# Patient Record
Sex: Female | Born: 1940 | Race: White | Hispanic: No | Marital: Married | Smoking: Never smoker
Health system: Southern US, Community
[De-identification: ages and names within clinical notes are randomized; demographics above are authoritative.]

## PROBLEM LIST (undated history)

## (undated) DIAGNOSIS — J45909 Unspecified asthma, uncomplicated: Secondary | ICD-10-CM

## (undated) DIAGNOSIS — F411 Generalized anxiety disorder: Secondary | ICD-10-CM

## (undated) DIAGNOSIS — K5190 Ulcerative colitis, unspecified, without complications: Secondary | ICD-10-CM

## (undated) DIAGNOSIS — I10 Essential (primary) hypertension: Secondary | ICD-10-CM

## (undated) HISTORY — PX: BACK SURGERY: SHX140

---

## 2015-10-27 ENCOUNTER — Emergency Department (HOSPITAL_BASED_OUTPATIENT_CLINIC_OR_DEPARTMENT_OTHER): Payer: Medicare HMO

## 2015-10-27 ENCOUNTER — Emergency Department (HOSPITAL_BASED_OUTPATIENT_CLINIC_OR_DEPARTMENT_OTHER)
Admission: EM | Admit: 2015-10-27 | Discharge: 2015-10-27 | Disposition: A | Discharge status: Home / Self Care | Payer: Medicare HMO | Attending: Emergency Medicine | Admitting: Emergency Medicine

## 2015-10-27 ENCOUNTER — Encounter (HOSPITAL_BASED_OUTPATIENT_CLINIC_OR_DEPARTMENT_OTHER): Payer: Self-pay | Admitting: Emergency Medicine

## 2015-10-27 DIAGNOSIS — R1030 Lower abdominal pain, unspecified: Secondary | ICD-10-CM | POA: Insufficient documentation

## 2015-10-27 DIAGNOSIS — I10 Essential (primary) hypertension: Secondary | ICD-10-CM | POA: Diagnosis not present

## 2015-10-27 DIAGNOSIS — J45909 Unspecified asthma, uncomplicated: Secondary | ICD-10-CM | POA: Insufficient documentation

## 2015-10-27 DIAGNOSIS — K529 Noninfective gastroenteritis and colitis, unspecified: Secondary | ICD-10-CM | POA: Diagnosis not present

## 2015-10-27 DIAGNOSIS — F411 Generalized anxiety disorder: Secondary | ICD-10-CM | POA: Diagnosis not present

## 2015-10-27 DIAGNOSIS — R5383 Other fatigue: Secondary | ICD-10-CM | POA: Diagnosis present

## 2015-10-27 DIAGNOSIS — R195 Other fecal abnormalities: Secondary | ICD-10-CM | POA: Insufficient documentation

## 2015-10-27 DIAGNOSIS — Z79899 Other long term (current) drug therapy: Secondary | ICD-10-CM | POA: Diagnosis not present

## 2015-10-27 HISTORY — DX: Generalized anxiety disorder: F41.1

## 2015-10-27 HISTORY — DX: Unspecified asthma, uncomplicated: J45.909

## 2015-10-27 HISTORY — DX: Essential (primary) hypertension: I10

## 2015-10-27 HISTORY — DX: Ulcerative colitis, unspecified, without complications: K51.90

## 2015-10-27 LAB — CBC WITH DIFFERENTIAL/PLATELET
Basophils Absolute: 0 10*3/uL (ref 0.0–0.1)
Basophils Relative: 1 %
Eosinophils Absolute: 0.4 10*3/uL (ref 0.0–0.7)
Eosinophils Relative: 5 %
HEMATOCRIT: 34.1 % — AB (ref 36.0–46.0)
HEMOGLOBIN: 11.2 g/dL — AB (ref 12.0–15.0)
LYMPHS ABS: 0.9 10*3/uL (ref 0.7–4.0)
Lymphocytes Relative: 11 %
MCH: 28.9 pg (ref 26.0–34.0)
MCHC: 32.8 g/dL (ref 30.0–36.0)
MCV: 87.9 fL (ref 78.0–100.0)
MONOS PCT: 18 %
Monocytes Absolute: 1.5 10*3/uL — ABNORMAL HIGH (ref 0.1–1.0)
NEUTROS PCT: 65 %
Neutro Abs: 5.4 10*3/uL (ref 1.7–7.7)
Platelets: 579 10*3/uL — ABNORMAL HIGH (ref 150–400)
RBC: 3.88 MIL/uL (ref 3.87–5.11)
RDW: 13.2 % (ref 11.5–15.5)
WBC: 8.3 10*3/uL (ref 4.0–10.5)

## 2015-10-27 LAB — COMPREHENSIVE METABOLIC PANEL
ALBUMIN: 3 g/dL — AB (ref 3.5–5.0)
ALK PHOS: 77 U/L (ref 38–126)
ALT: 14 U/L (ref 14–54)
ANION GAP: 6 (ref 5–15)
AST: 17 U/L (ref 15–41)
BUN: 18 mg/dL (ref 6–20)
CALCIUM: 8.7 mg/dL — AB (ref 8.9–10.3)
CHLORIDE: 105 mmol/L (ref 101–111)
CO2: 27 mmol/L (ref 22–32)
CREATININE: 1.06 mg/dL — AB (ref 0.44–1.00)
GFR calc non Af Amer: 50 mL/min — ABNORMAL LOW (ref >60)
GFR, EST AFRICAN AMERICAN: 58 mL/min — AB (ref >60)
GLUCOSE: 100 mg/dL — AB (ref 65–99)
Potassium: 3.8 mmol/L (ref 3.5–5.1)
SODIUM: 138 mmol/L (ref 135–145)
Total Bilirubin: 0.4 mg/dL (ref 0.3–1.2)
Total Protein: 6.5 g/dL (ref 6.5–8.1)

## 2015-10-27 LAB — OCCULT BLOOD X 1 CARD TO LAB, STOOL: Fecal Occult Bld: POSITIVE — AB

## 2015-10-27 LAB — LIPASE, BLOOD: Lipase: 21 U/L (ref 11–51)

## 2015-10-27 MED ORDER — PREDNISONE 10 MG PO TABS: 20.0000 mg | ORAL_TABLET | Freq: Two times a day (BID) | ORAL | Status: DC

## 2015-10-27 MED ORDER — SODIUM CHLORIDE 0.9 % IV BOLUS (SEPSIS)
1000.0000 mL | Freq: Once | INTRAVENOUS | Status: AC
  Administered 2015-10-27: 1000 mL via INTRAVENOUS

## 2015-10-27 MED ORDER — IOPAMIDOL (ISOVUE-300) INJECTION 61%
100.0000 mL | Freq: Once | INTRAVENOUS | Status: AC | PRN
  Administered 2015-10-27: 100 mL via INTRAVENOUS

## 2015-10-27 MED ORDER — CIPROFLOXACIN HCL 500 MG PO TABS: 500.0000 mg | ORAL_TABLET | Freq: Two times a day (BID) | ORAL | Status: DC

## 2015-10-27 MED ORDER — METRONIDAZOLE 500 MG PO TABS: 500.0000 mg | ORAL_TABLET | Freq: Three times a day (TID) | ORAL | Status: DC

## 2015-10-27 MED FILL — metroNIDAZOLE 500 MG TABS: 500 | 7 days supply | Qty: 21 | Fill #0

## 2015-10-27 MED FILL — predniSONE 10 MG TABS: 10 | 5 days supply | Qty: 20 | Fill #0

## 2015-10-27 MED FILL — CIPROFLOXACIN HCL 500 MG TA: 500 | 7 days supply | Qty: 14 | Fill #0

## 2015-10-27 NOTE — ED Provider Notes (Signed)
CSN: [id number]     Arrival date & time 10/27/15  1001 History   First MD Initiated Contact with Patient 10/27/15 1044     Chief Complaint  Patient presents with  . Fatigue     (Consider location/radiation/quality/duration/timing/severity/associated sxs/prior Treatment) HPI Comments: Patient is a 75 year old female with history of ulcerative colitis, hypertension, and asthma. She presents for evaluation of lower abdominal discomfort and bloody stools which have been worsening over the past 2 weeks. She does report that she was on multiple antibiotics recently for bronchitis. She was seen by digestive health services in [place], however no other treatment was initiated. He denies any fevers or chills. She denies any vomiting. She denies any ill contacts.  The history is provided by the patient.    Past Medical History  Diagnosis Date  . Hypertension   . Asthma   . Generalized anxiety disorder   . Ulcerative colitis [facility](HCC)    Past Surgical History  Procedure Laterality Date  . Back surgery     No family history on file. Social History  Substance Use Topics  . Smoking status: Never Smoker   . Smokeless tobacco: None  . Alcohol Use: No   OB History    No data available     Review of Systems  All other systems reviewed and are negative.     Allergies  Review of patient's allergies indicates no known allergies.  Home Medications   Prior to Admission medications   Medication Sig Start Date End Date Taking? Authorizing Provider  ALPRAZolam Prudy Feeler(XANAX) 0.25 MG tablet Take 0.25 mg by mouth at bedtime as needed for anxiety.   Yes Historical Provider, MD  estrogens conjugated, synthetic A, (CENESTIN) 0.3 MG tablet Take 0.3 mg by mouth daily.   Yes Historical Provider, MD  Fluticasone Furoate-Vilanterol (BREO ELLIPTA IN) Inhale into the lungs.   Yes Historical Provider, MD  losartan (COZAAR) 50 MG tablet Take 50 mg by mouth daily.   Yes Historical Provider, MD  timolol  (TIMOPTIC) 0.5 % ophthalmic solution 1 drop 2 (two) times daily.   Yes Historical Provider, MD   There were no vitals taken for this visit. Physical Exam  Constitutional: She is oriented to person, place, and time. She appears well-developed and well-nourished. No distress.  HENT:  Head: Normocephalic and atraumatic.  Neck: Normal range of motion. Neck supple.  Cardiovascular: Normal rate and regular rhythm.  Exam reveals no gallop and no friction rub.   No murmur heard. Pulmonary/Chest: Effort normal and breath sounds normal. No respiratory distress. She has no wheezes.  Abdominal: Soft. Bowel sounds are normal. She exhibits no distension. There is no tenderness.  Genitourinary: Guaiac positive stool.  Rectal examination reveals no palpable masses, significantly inflamed hemorrhoids, or other abnormality.  Musculoskeletal: Normal range of motion.  Neurological: She is alert and oriented to person, place, and time.  Skin: Skin is warm and dry. She is not diaphoretic.  Nursing note and vitals reviewed.   ED Course  Procedures (including critical care time) Labs Review Labs Reviewed - No data to display  Imaging Review No results found. I have personally reviewed and evaluated these images and lab results as part of my medical decision-making.    MDM   Final diagnoses:  None    CT scan does show colitis. The remainder of her laboratory studies and workup are reassuring. Her abdominal exam does not suggest a surgical abdomen. She will be treated with Cipro and Flagyl, prednisone, and follow-up with her  gastroenterologist in [place]. She has recently been on antibiotics and this colitis could possibly be related to C. difficile. She also has a history of ulcerative colitis and could also be related to this.    Geoffery Lyons, MD 10/27/15 [phone]

## 2015-10-27 NOTE — ED Notes (Signed)
Pt returned from ct,  

## 2015-10-27 NOTE — Discharge Instructions (Signed)
Cipro and Flagyl as prescribed.  Prednisone as prescribed.  Follow-up with your gastroenterologist the end of this week, and return to the ER symptoms significantly worsen or change.   Colitis Colitis is inflammation of the colon. Colitis may last a short time (acute) or it may last a long time (chronic). CAUSES This condition may be caused by:  Viruses.  Bacteria.  Reactions to medicine.  Certain autoimmune diseases, such as Crohn disease or ulcerative colitis. SYMPTOMS Symptoms of this condition include:  Diarrhea.  Passing bloody or tarry stool.  Pain.  Fever.  Vomiting.  Tiredness (fatigue).  Weight loss.  Bloating.  Sudden increase in abdominal pain.  Having fewer bowel movements than usual. DIAGNOSIS This condition is diagnosed with a stool test or a blood test. You may also have other tests, including X-rays, a CT scan, or a colonoscopy. TREATMENT Treatment may include:  Resting the bowel. This involves not eating or drinking for a period of time.  Fluids that are given through an IV tube.  Medicine for pain and diarrhea.  Antibiotic medicines.  Cortisone medicines.  Surgery. HOME CARE INSTRUCTIONS Eating and Drinking  Follow instructions from your health care provider about eating or drinking restrictions.  Drink enough fluid to keep your urine clear or pale yellow.  Work with a dietitian to determine which foods cause your condition to flare up.  Avoid foods that cause flare-ups.  Eat a well-balanced diet. Medicines  Take over-the-counter and prescription medicines only as told by your health care provider.  If you were prescribed an antibiotic medicine, take it as told by your health care provider. Do not stop taking the antibiotic even if you start to feel better. General Instructions  Keep all follow-up visits as told by your health care provider. This is important. SEEK MEDICAL CARE IF:  Your symptoms do not go away.  You  develop new symptoms. SEEK IMMEDIATE MEDICAL CARE IF:  You have a fever that does not go away with treatment.  You develop chills.  You have extreme weakness, fainting, or dehydration.  You have repeated vomiting.  You develop severe pain in your abdomen.  You pass bloody or tarry stool.   This information is not intended to replace advice given to you by your health care provider. Make sure you discuss any questions you have with your health care provider.   Document Released: 08/05/2004 Document Revised: 03/19/2015 Document Reviewed: 10/21/2014 Elsevier Interactive Patient Education [employer]2016 Elsevier Inc.

## 2015-10-27 NOTE — ED Notes (Signed)
MD at bedside. 

## 2015-10-27 NOTE — ED Notes (Signed)
Pt's main complaint is rectal pain, pt states it is raw and that stool is unable to move due to how raw the inside of stomach has become, secondly she states she has had multiple loose bloody bm's with last bowel movement that she describes as bright red blood. Pt would like some IV fluids for dehydration

## 2015-10-27 NOTE — ED Notes (Signed)
Pt states she started having bloody stools 2 weeks ago and was seen at digestive center in [place], states she was tested for cancer and was told she had blood in stool, paperwork from digestive center states she had a negative hemocult, per pt she is followed at [facility] also, today complains of weakness and dehydration among multiple bloody stools

## 2015-10-27 NOTE — ED Notes (Signed)
Patient transported to X-ray 

## 2016-08-03 ENCOUNTER — Ambulatory Visit (INDEPENDENT_AMBULATORY_CARE_PROVIDER_SITE_OTHER): Payer: Medicare HMO | Admitting: Sports Medicine

## 2016-08-03 ENCOUNTER — Encounter: Payer: Self-pay | Admitting: Sports Medicine

## 2016-08-03 DIAGNOSIS — M25561 Pain in right knee: Secondary | ICD-10-CM | POA: Diagnosis not present

## 2016-08-03 DIAGNOSIS — M25551 Pain in right hip: Secondary | ICD-10-CM

## 2016-08-03 DIAGNOSIS — M25562 Pain in left knee: Secondary | ICD-10-CM | POA: Diagnosis not present

## 2016-08-03 NOTE — Progress Notes (Signed)
Margaret Wilkins - 76 y.o. female MRN [id number]  Date of birth: 1940-11-23  SUBJECTIVE:  Including CC & ROS.   Margaret Wilkins is a 76 year old female that is presenting with right hip pain and bilateral knee pain. She reports that her right hip pain started on the anterior aspect of her groin and felt that may be related to performing a post breast. She denied any bruising or swelling when this first occurred. She is now locating the pain predominantly on the lateral aspect of her right hip but also feels it on the left lateral hip as well. She has taken Aleve with some improvement of the pain. This pain is been present for about one month. The pain remains to be unchanged and it feels like a burn. She feels pain when she rolls over to that side at night.  She is also reporting bilateral knee pain that is anterior nature. This pain started 3-4 months ago. She notices pain when she is going up or down stairs. The plain disperses when she is done walking up or down stairs. The pain is remaining the same. She denies any history of any knee trauma or injury. She denies any history of knee surgery.  She does report a remote history of falling off a golf cart while at a wedding and reports her SI joint was subluxed. She had to go to a chiropractor in order for this to be put back in place.  She has a history of ulcerative colitis but is only taken a couple courses of prednisone and no long time use.  ROS: No unexpected weight loss, fever, chills, swelling, instability, numbness/tingling, redness, otherwise see HPI    HISTORY: Past Medical, Surgical, Social, and Family History Reviewed & Updated per EMR.   Pertinent Historical Findings include: PMSHx -  asthma, ulcerative colitis, hysterectomy PSHx -  no tobacco or alcohol use FHx -  ulcerative colitis, Crohn's disease Medications -mesalamine  DATA REVIEWED: None  PHYSICAL EXAM:  VS: BP:(!) 155/77  HR:66bpm  TEMP: ( )  RESP:   HT:5\' 4"  (162.6 cm)    WT:150 lb (68 kg)  BMI:25.8 PHYSICAL EXAM: Gen: NAD, alert, cooperative with exam, well-appearing HEENT: clear conjunctiva, EOMI CV:  no edema, capillary refill brisk,  Resp: non-labored, normal speech Skin: no rashes, normal turgor  Neuro: no gross deficits.  Psych:  alert and oriented Hip:  Some tenderness to palpation of the greater trochanter on the left and right. No tenderness to palpation of the piriformis on the right. No tenderness to palpation over SI joints bilaterally. Weakness with hip flexion to resistance on the right greater than the left. Significant weakness to resistance with hip abduction bilaterally. Has trouble with standing hip flexion and lateral rotation on the right Normal internal and rotation bilaterally. FABER is limited on the right Normal FADIR bilaterally The right SI joint seems to be stuck down compared to the left with pelvic rocking  Negative straight leg raise bilaterally Knee: Normal to inspection with no erythema or effusion or obvious bony abnormalities. Palpation normal with no warmth, joint line tenderness, patellar tenderness, or condyle tenderness. ROM full in flexion and extension and lower leg rotation. Ligaments with solid consistent endpoints including , LCL, MCL. Negative Mcmurray's Non painful patellar compression. Patellar glide without crepitus. Patellar and quadriceps tendons unremarkable. Hamstring and quadriceps strength is normal.  Normal plantar and dorsiflexion bilaterally. Neurovascularly intact   ASSESSMENT & PLAN:   Right hip pain Seems to be related to muscle imbalance  with the weakness in her gluteus medius. There is possible that some of this could be stemming from her back from her prior injury a few years ago that involved her SI joint. - Initiate home exercise program -She'll follow-up in 4 weeks. If there is no improvement with the home exercise program may need to consider an x-ray of her pelvis and her  back. She does report having a history of sciatica.   Bilateral knee pain Most likely the knee pain is related to the imbalance of her gluteus medius at her hips. Her  VMO appears to be strong but it is possible that she may have a component of patellofemoral syndrome. We will start strengthening her hips and see how her knee pain does.

## 2016-08-04 DIAGNOSIS — M25551 Pain in right hip: Secondary | ICD-10-CM | POA: Insufficient documentation

## 2016-08-04 DIAGNOSIS — M25562 Pain in left knee: Secondary | ICD-10-CM

## 2016-08-04 DIAGNOSIS — M25561 Pain in right knee: Secondary | ICD-10-CM | POA: Insufficient documentation

## 2016-08-04 NOTE — Assessment & Plan Note (Signed)
Most likely the knee pain is related to the imbalance of her gluteus medius at her hips. Her  VMO appears to be strong but it is possible that she may have a component of patellofemoral syndrome. We will start strengthening her hips and see how her knee pain does.

## 2016-08-04 NOTE — Assessment & Plan Note (Signed)
Seems to be related to muscle imbalance with the weakness in her gluteus medius. There is possible that some of this could be stemming from her back from her prior injury a few years ago that involved her SI joint. - Initiate home exercise program -She'll follow-up in 4 weeks. If there is no improvement with the home exercise program may need to consider an x-ray of her pelvis and her back. She does report having a history of sciatica.

## 2016-09-30 ENCOUNTER — Other Ambulatory Visit: Payer: Self-pay | Admitting: Sports Medicine

## 2016-09-30 ENCOUNTER — Encounter (INDEPENDENT_AMBULATORY_CARE_PROVIDER_SITE_OTHER): Payer: Self-pay

## 2016-09-30 ENCOUNTER — Ambulatory Visit (INDEPENDENT_AMBULATORY_CARE_PROVIDER_SITE_OTHER): Payer: Medicare HMO | Admitting: Sports Medicine

## 2016-09-30 ENCOUNTER — Ambulatory Visit: Payer: Self-pay

## 2016-09-30 ENCOUNTER — Ambulatory Visit
Admission: RE | Admit: 2016-09-30 | Discharge: 2016-09-30 | Disposition: A | Discharge status: Home / Self Care | Payer: Medicare HMO | Source: Ambulatory Visit | Attending: Sports Medicine | Admitting: Sports Medicine

## 2016-09-30 ENCOUNTER — Encounter: Payer: Self-pay | Admitting: Sports Medicine

## 2016-09-30 VITALS — BP 143/47 | Ht 64.0 in | Wt 150.0 lb

## 2016-09-30 DIAGNOSIS — M25561 Pain in right knee: Secondary | ICD-10-CM

## 2016-09-30 DIAGNOSIS — M25562 Pain in left knee: Secondary | ICD-10-CM

## 2016-09-30 DIAGNOSIS — M25551 Pain in right hip: Secondary | ICD-10-CM

## 2016-09-30 NOTE — Progress Notes (Signed)
Margaret Wilkins - 76 y.o. female MRN [id number]  Date of birth: 02/19/1941  SUBJECTIVE:  Including CC & ROS.  CC: right hip pain  Presents with follow-up of right hip pain and bilateral knee discomfort. For her hip pain, she states that she is doing better. She has been doing the exercises given to her last visit. She still has pain from time to time but it is a lot better. She'll occasionally take Aleve with relief. However she has not needed to take Aleve for the past 2 weeks. She does have a history of colitis and would like to make sure that her medications to not aggravate her intestines. She also complains of bilateral anterior leg burning on the quadriceps muscles. She reports it is worse when she is sitting for long periods. Denies any numbness or tingling. Denies much weakness in the bilateral lower extremities. He has also having problems standing up from a sitting position. And she is having problems going up and down stairs.    ROS: No unexpected weight loss, fever, chills, swelling, instability, muscle pain, numbness/tingling, redness, otherwise see HPI   PMHx - Updated and reviewed.  Contributory factors include: Colitis, hypertension PSHx - Updated and reviewed.  Contributory factors include:  Negative FHx - Updated and reviewed.  Contributory factors include:  Negative Social Hx - Updated and reviewed. Contributory factors include: Negative Medications - reviewed   DATA REVIEWED: Previous office note  PHYSICAL EXAM:  VS: BP:(!) 143/47  HR: bpm  TEMP: ( )  RESP:   HT:5\' 4"  (162.6 cm)   WT:150 lb (68 kg)  BMI:25.8 PHYSICAL EXAM: Gen: NAD, alert, cooperative with exam, well-appearing HEENT: clear conjunctiva,  CV:  no edema, capillary refill brisk, normal rate Resp: non-labored Skin: no rashes, normal turgor  Neuro: no gross deficits.  Psych:  alert and oriented  Back Exam:  Inspection: Unremarkable  Palpable tenderness: None. Range of Motion:  Flexion 45 deg;  Extension 45 deg; Side Bending to 45 deg bilaterally; Rotation to 45 deg bilaterally  Leg strength: Quad: 5/5 Hamstring: 5/5 Hip flexor: 5/5 Hip abductors: 5/5  Strength at foot: Plantar-flexion: 5/5 Dorsi-flexion: 5/5 Eversion: 5/5 Inversion: 5/5  Sensory change: Gross sensation intact to all lumbar and sacral dermatomes.  Reflexes: 3+ at both patellar tendons, Babinski's downgoing.  Gait unremarkable. SLR laying: Negative  XSLR laying: Negative  FABER: Positive.  Hip: ROM IR: 35 Deg on right, 45 on left, ER: 45 Deg, Flexion: 80 Deg, Extension: 60 Deg, Abduction: 45 Deg, Adduction: 45 Deg Strength IR: 5/5, ER: 5/5, Flexion: 5/5, Extension: 5/5, Abduction: 4/5 bilaterally, Adduction: 5/5 Pelvic alignment unremarkable to inspection and palpation. Standing hip rotation and gait without trendelenburg sign / unsteadiness. Greater trochanter without tenderness to palpation. Positive pain with FABER or FADIR in groin. No SI joint tenderness and normal minimal SI movement.  Knee: Normal to inspection with no erythema or effusion or obvious bony abnormalities. Palpation with no warmth, patellar tenderness, or condyle tenderness. Has medial joint line tenderness on right ROM full in flexion and extension and lower leg rotation. Ligaments with solid consistent endpoints including ACL, PCL, LCL, MCL. Negative Mcmurray's, Apley's, and Thessalonian tests. Non painful patellar compression. Patellar glide with crepitus bilaterally. Patellar and quadriceps tendons unremarkable. Hamstring and quadriceps strength is normal.     ASSESSMENT & PLAN:   Right hip pain Recommend continuing her exercises. Because she is still having pain with motion of right hip, recommended right hip x-ray to assess for arthritis of the hip.  This also could be contributing to her burning sensation  Bilateral knee pain Ultrasound did not show much arthritis. We'll get standing x-rays to assess this. Suspect at least  patellofemoral arthritis. This could be contributing to her bilateral leg paresthesias.

## 2016-09-30 NOTE — Assessment & Plan Note (Signed)
Recommend continuing her exercises. Because she is still having pain with motion of right hip, recommended right hip x-ray to assess for arthritis of the hip. This also could be contributing to her burning sensation

## 2016-09-30 NOTE — Assessment & Plan Note (Signed)
Ultrasound did not show much arthritis. We'll get standing x-rays to assess this. Suspect at least patellofemoral arthritis. This could be contributing to her bilateral leg paresthesias.

## 2016-10-13 NOTE — Addendum Note (Signed)
Addended byFrankey Poot on: 10/13/2016 01:48 PM   Modules accepted: Orders

## 2017-12-20 ENCOUNTER — Encounter: Payer: Self-pay | Admitting: Sports Medicine

## 2017-12-20 ENCOUNTER — Ambulatory Visit (INDEPENDENT_AMBULATORY_CARE_PROVIDER_SITE_OTHER): Payer: Medicare HMO | Admitting: Sports Medicine

## 2017-12-20 VITALS — BP 122/70 | Ht 64.0 in | Wt 146.0 lb

## 2017-12-20 DIAGNOSIS — M25511 Pain in right shoulder: Secondary | ICD-10-CM | POA: Diagnosis not present

## 2017-12-20 DIAGNOSIS — M85851 Other specified disorders of bone density and structure, right thigh: Secondary | ICD-10-CM | POA: Diagnosis not present

## 2017-12-20 DIAGNOSIS — M25519 Pain in unspecified shoulder: Secondary | ICD-10-CM | POA: Insufficient documentation

## 2017-12-20 DIAGNOSIS — M79621 Pain in right upper arm: Secondary | ICD-10-CM

## 2017-12-20 DIAGNOSIS — M816 Localized osteoporosis [Lequesne]: Secondary | ICD-10-CM

## 2017-12-20 DIAGNOSIS — M8580 Other specified disorders of bone density and structure, unspecified site: Secondary | ICD-10-CM | POA: Insufficient documentation

## 2017-12-20 NOTE — Assessment & Plan Note (Signed)
Bring back for diagnostic US  STill has some l;imit to ROM but no frozen shoulder  Evaluate further and plan Rx

## 2017-12-20 NOTE — Assessment & Plan Note (Signed)
Excellent strength  Candidate for conservative care  Strength exercises Prevention Balance VIT D and Ca  DW her PCP

## 2017-12-20 NOTE — Progress Notes (Signed)
Subjective:    Margaret Wilkins is a 77 y.o. old female here to discuss about her DEXA scan and right shoulder pain.  HPI DEXA scan: Patient had a DEXA scan recently which showed a T score of -1.7 at lumbar vertebra and -2.5 at femoral neck.  She has no prior history of hip fracture or family history of hip fracture.  She is on calcium and vitamin D daily.  She is not on PPI or steroid.  She never been a smoker.  She drinks occasionally.  She is very active. She reports hiking and biking regularly.    Right shoulder pain: she started bowling with her friends about 9 months ago.  Then she gradually started feeling some pain over the lateral aspect of her right arm at insertion site of the deltoid about 6 months ago.  She has not done bowling in the last 6 months.  She reports difficulty abducting her shoulder that has gotten better gradually.  PMH/Problem List: has Right hip pain and Bilateral knee pain on their problem list.   has a past medical history of Asthma, Generalized anxiety disorder, Hypertension, and Ulcerative colitis (HCC).  FH:  No family history on file.  SH Social History   Tobacco Use  . Smoking status: Never Smoker  . Smokeless tobacco: Never Used  Substance Use Topics  . Alcohol use: No  . Drug use: Not on file    Review of Systems Review of systems negative except for pertinent positives and negatives in history of present illness above.     Objective:     Vitals:   12/20/17 0909  BP: 122/70  Weight: 146 lb (66.2 kg)  Height: 5\' 4"  (1.626 m)   Body mass index is 25.06 kg/m.  Physical Exam  GEN: appears well & comfortable. No apparent distress. MSK:  Right upper extremity/shoulder  Inspection reveals no abnormalities, atrophy or asymmetry.  Palpation is tenderness over the lateral aspect of her right upper arm at the insertion site of the deltoid muscle.  ROM with slightly limited flexion and abduction to about 160 degree.  Relatively full external and  internal rotation.  Rotator cuff strength somewhat symmetric and normal  No signs of impingement  Neurovascularly intact  LE exam:  Good abductor muscle strength bilaterally Normal gait Normal Trendelenburg test Diminished proprioception with standing on one foot with closed eyes  NEURO: alert and oiented appropriately, no gross deficits   PSYCH: euthymic mood with congruent affect    Assessment and Plan:  1. Pain in right upper arm: likely due to deltoid muscle strain/injury.  She has focal tenderness to palpation over lateral aspect of her right upper arm at the insertion site of deltoid muscle.  Exam not impressive for rotator cuff syndrome but hard to exclude.  Doubt significant intra-articular process of the shoulder joint.  Overall, hip pain and range of motion is improving which is reassuring. We will do bedside ultrasound of right shoulder/upper arm when she returns in 4 days.  2. Localized osteoporosis without current pathological fracture: DEXA scan with T score of -1.7 at lumbar vertebra and -2.5 at femoral neck.  FRAX the score 13% for osteoporotic fracture and 5.7% for femoral neck fracture meriting treatment for osteoporosis.  We suggested discussing this with her primary care doctor.  Meanwhile, recommended continuing Vitamin D and calcium, strengthening and balance exercise.  Return in about 4 days (around 12/24/2017) for Right shoulder/arm ultrasound.  Almon Herculesaye T Gonfa, MD 12/20/17 Pager: [phone]  I observed and  examined the patient with the resident and agree with assessment and plan.  Note reviewed and modified by me. Enid Baas, MD

## 2017-12-23 ENCOUNTER — Encounter: Payer: Self-pay | Admitting: Family Medicine

## 2017-12-23 ENCOUNTER — Ambulatory Visit (INDEPENDENT_AMBULATORY_CARE_PROVIDER_SITE_OTHER): Payer: Medicare HMO | Admitting: Family Medicine

## 2017-12-23 ENCOUNTER — Ambulatory Visit: Payer: Self-pay

## 2017-12-23 VITALS — BP 124/50 | Ht 64.0 in | Wt 147.0 lb

## 2017-12-23 DIAGNOSIS — M25511 Pain in right shoulder: Secondary | ICD-10-CM | POA: Diagnosis not present

## 2017-12-23 DIAGNOSIS — M75101 Unspecified rotator cuff tear or rupture of right shoulder, not specified as traumatic: Secondary | ICD-10-CM | POA: Insufficient documentation

## 2017-12-23 MED ORDER — DICLOFENAC SODIUM 1 % TD GEL: 2.0000 g | Freq: Four times a day (QID) | TRANSDERMAL | 0 refills | Status: DC

## 2017-12-23 NOTE — Assessment & Plan Note (Signed)
Patient is here with signs and symptoms consistent with a rotator cuff tear at the supraspinatus tendon.  Patient states that initial injury and pain onset was approximately 6 months ago.  Increased vascular changes noted signifying the bodies attempts to heal. -Strongly encouraged traumatic decrease in physical load of this shoulder. (Patient regularly carries 50 pound bags of bird seed.) -HEP: Rotator cuff exercises with low resistance Thera-Band provided today. -Ice regularly -Topical nitro patches NOT given due to evidence of increased blood flow in this region already. -Voltaren gel as needed. -Follow-up 4 to 6 weeks with Dr. Darrick Pennafields.

## 2017-12-23 NOTE — Progress Notes (Addendum)
HPI  CC: Right shoulder injury Patient is here today for right shoulder ultrasound after sustaining a right-sided shoulder injury approximately 6 months ago while bowling.  She states that pain is been persistent.  She still has difficulty with overhead movements.  Pain is located to the anterior lateral aspect of the shoulder.  Pain occasionally radiates down to the distal deltoid.  She denies any numbness, weakness, or paresthesias distally.  No new injury since last being seen.  See HPI and/or previous note for associated ROS.  Objective: BP (!) 124/50   Ht 5\' 4"  (1.626 m)   Wt 147 lb (66.7 kg)   BMI 25.23 kg/m  Gen: NAD, well groomed, a/o x3, normal affect.  CV: Well-perfused. Warm.  Resp: Non-labored.  Neuro: Sensation intact throughout. No gross coordination deficits.  Gait: Nonpathologic posture, unremarkable stride without signs of limp or balance issues. Shoulder, Right: TTP noted at the anterior lateral aspect of the shoulder. No evidence of bony deformity, asymmetry, or muscle atrophy; No tenderness over long head of biceps (bicipital groove). No TTP at [facility] joint. Active ROM reduced in abduction and forward flexion to approximately 100. Passive ROM Full. Strength 5/5 throughout. No abnormal scapular function observed. Sensation intact. Peripheral pulses intact.  Special Tests:   - Crossarm test: NEG   - Empty can: Positive   - Hawkins: Positive   - Obrien's test: Positive   - Yergason's: NEG   - Speeds test: NEG  ULTRASOUND: Shoulder, Right  Diagnostic limited ultrasound imaging obtained of patient's right shoulder.  - No obvious evidence of bony deformity or osteophyte development appreciated.  - Long head of the biceps tendon: No evidence of tendon thickening, calcification, subluxation, or tearing in short or long axis views. No edema or bullseye sign.  - Subscapularis tendon: complete visualization across the width of the insertion point yielded no evidence of tendon  thickening, calcification, or tears in the long axis view.  - Supraspinatus tendon: complete visualization across the width of the insertion point yielded evidence of tendon edema and partial thickness tearing over the superficial aspect of the tendon as it inserts upon the humeral head.  Dramatically increased vascular changes noted at this site. - Infraspinatus and teres minor tendons: visualization across the width of the insertion points yielded no evidence of tendon thickening, calcification, or tears in the long axis view.  [facility] Joint: No evidence of joint separation, collapse, or osteophyte development appreciated. No effusion present.  IMPRESSION: findings consistent with acute supraspinatus tendon partial tear.   Assessment and plan:  Tear of right supraspinatus tendon Patient is here with signs and symptoms consistent with a rotator cuff tear at the supraspinatus tendon.  Patient states that initial injury and pain onset was approximately 6 months ago.  Increased vascular changes noted signifying the bodies attempts to heal. -Strongly encouraged traumatic decrease in physical load of this shoulder. (Patient regularly carries 50 pound bags of bird seed.) -HEP: Rotator cuff exercises with low resistance Thera-Band provided today. -Ice regularly -Topical nitro patches NOT given due to evidence of increased blood flow in this region already. -Voltaren gel as needed. -Follow-up 4 to 6 weeks with Dr. Darrick Penna.   Orders Placed This Encounter  Procedures  . [place] COMPLETE JOINT SPACE STRUCTURES UP RIGHT    Standing Status:   Future    Number of Occurrences:   1    Standing Expiration Date:   02/22/2019    Order Specific Question:   Reason for Exam (SYMPTOM  OR DIAGNOSIS REQUIRED)    Answer:   right shoulder pain    Order Specific Question:   Preferred imaging location?    Answer:   Internal    Meds ordered this encounter  Medications  . diclofenac sodium (VOLTAREN) 1 % GEL    Sig: Apply  2 g topically 4 (four) times daily.    Dispense:  100 g    Refill:  0     Kathee DeltonIan D McKeag, MD,MS [facility] Fellow 12/23/2017 5:20 PM

## 2018-02-07 ENCOUNTER — Encounter: Payer: Self-pay | Admitting: Sports Medicine

## 2018-02-07 ENCOUNTER — Ambulatory Visit: Payer: Medicare HMO | Admitting: Sports Medicine

## 2018-02-07 DIAGNOSIS — M75101 Unspecified rotator cuff tear or rupture of right shoulder, not specified as traumatic: Secondary | ICD-10-CM | POA: Diagnosis not present

## 2018-02-07 NOTE — Progress Notes (Signed)
RT shoulder pain Original injury from bowling about 8 mos ago  Since last visit she is much improved Better motion and able to reach overhead Night pain has resolved Doing theraband HEP This seems to help Not using any meds  ROS No radicular sxs into \\RT  arm No neck pain  PE Pleasant older female, NAD BP 108/62   Ht 5\' 4"  (1.626 m)   Wt 140 lb (63.5 kg)   BMI 24.03 kg/m   ROM of RT shoulder is improved and only 5% or so less than left on elevation and back scratch Good strength IR and ER Good deltoid strength Weakness only noted on empty can or any impingement test  Ultrasound RT shoulder Small defect persists in distal supraspinatus This is about 40% of what was noted 6 weeks ago No increase in doppler flow as was noted before Other tendons intact BT intact  Impression: Healing distal supraspinatus partial tear  Ultrasound and interpretation by Sibyl ParrKarl B. Darrick PennaFields, MD

## 2018-02-07 NOTE — Assessment & Plan Note (Signed)
Significant improvement since last visit  Cont on HEP Cont ROM  Reck in about 6 weeks Still no lifting > 10 lbs

## 2018-03-21 ENCOUNTER — Ambulatory Visit: Payer: Medicare HMO | Admitting: Sports Medicine

## 2018-09-22 IMAGING — DX DG KNEE COMPLETE 4+V*L*
4 series · 4 of 4 positions shown · non-contrast
Comparison: None.

CLINICAL DATA: Bilateral knee pain a few months.  No injury.

EXAM:
LEFT KNEE - COMPLETE 4+ VIEW

[dg knee complete 4 views left (1 of 4)]
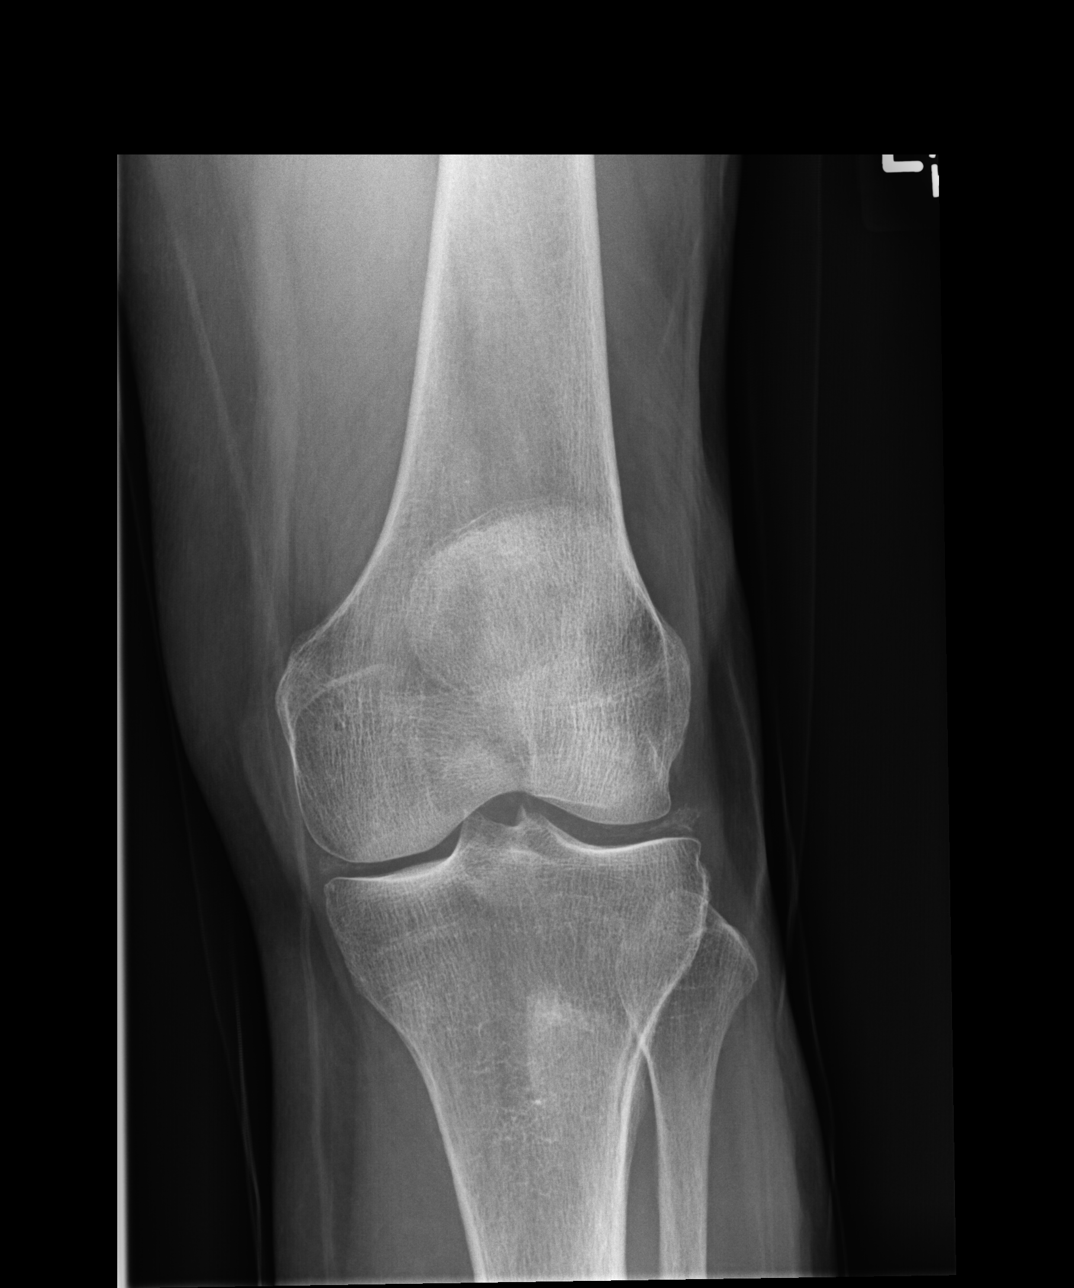

[dg knee complete 4 views left (2 of 4)]
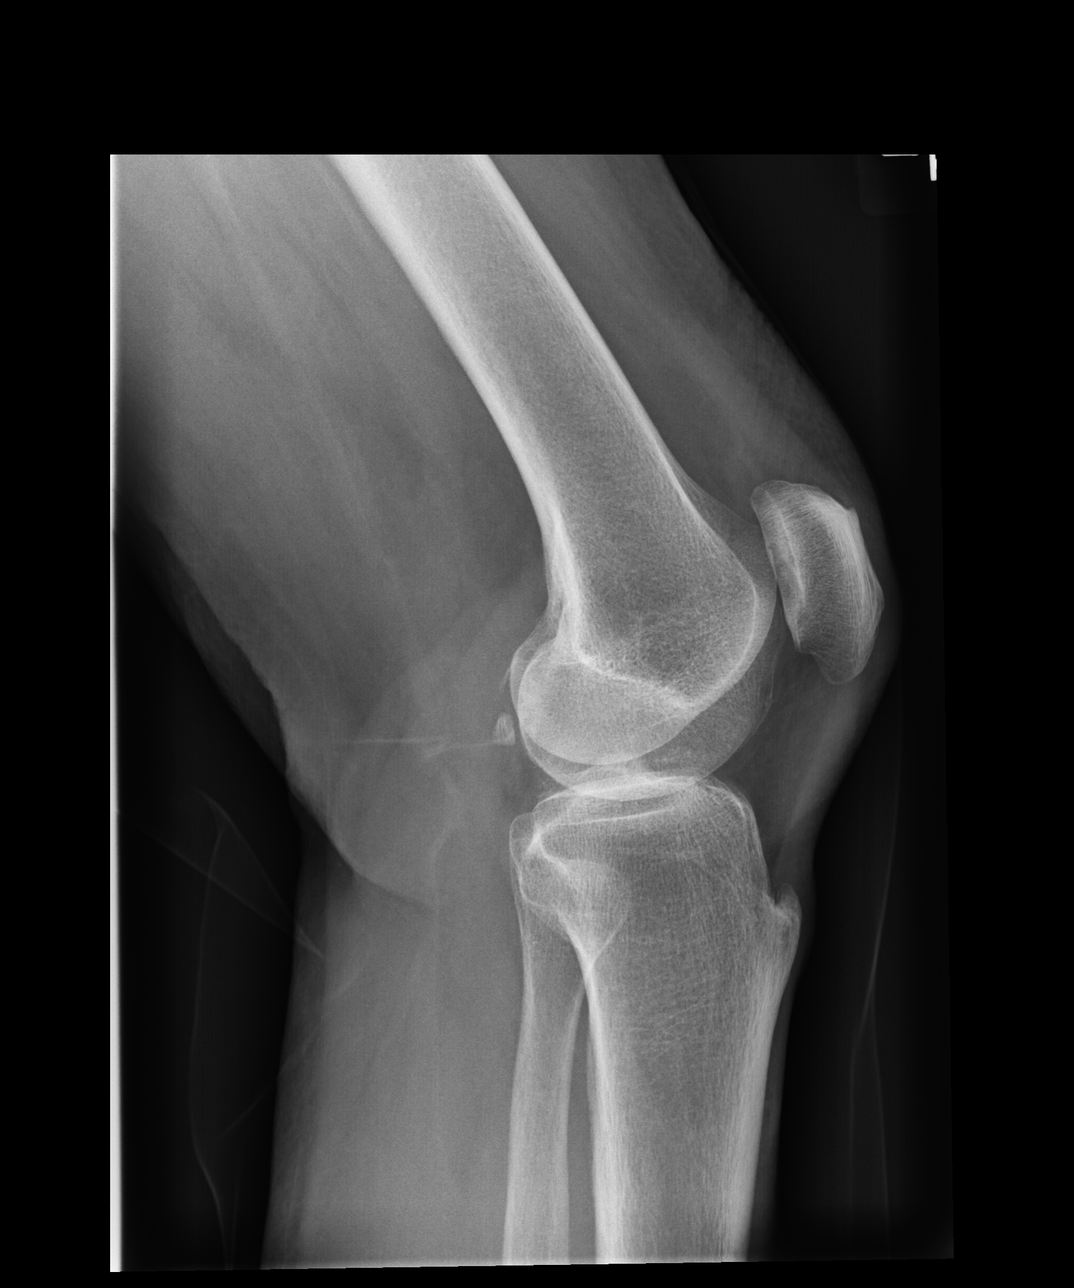

[dg knee complete 4 views left (3 of 4)]
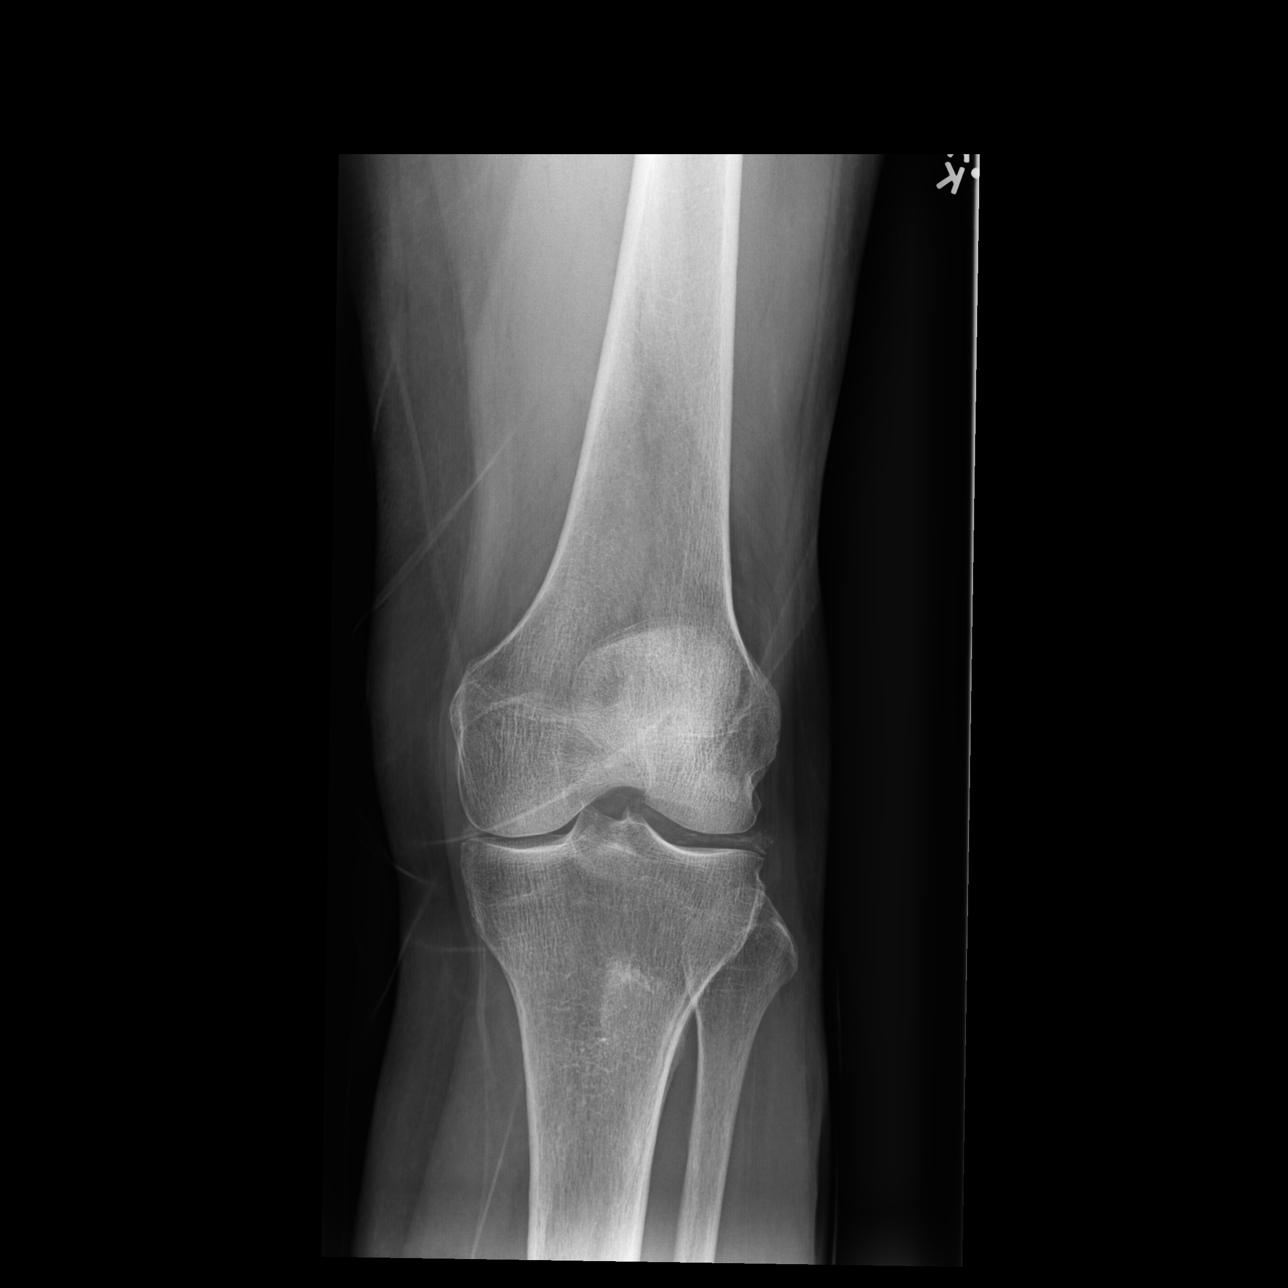

[dg knee complete 4 views left (4 of 4)]
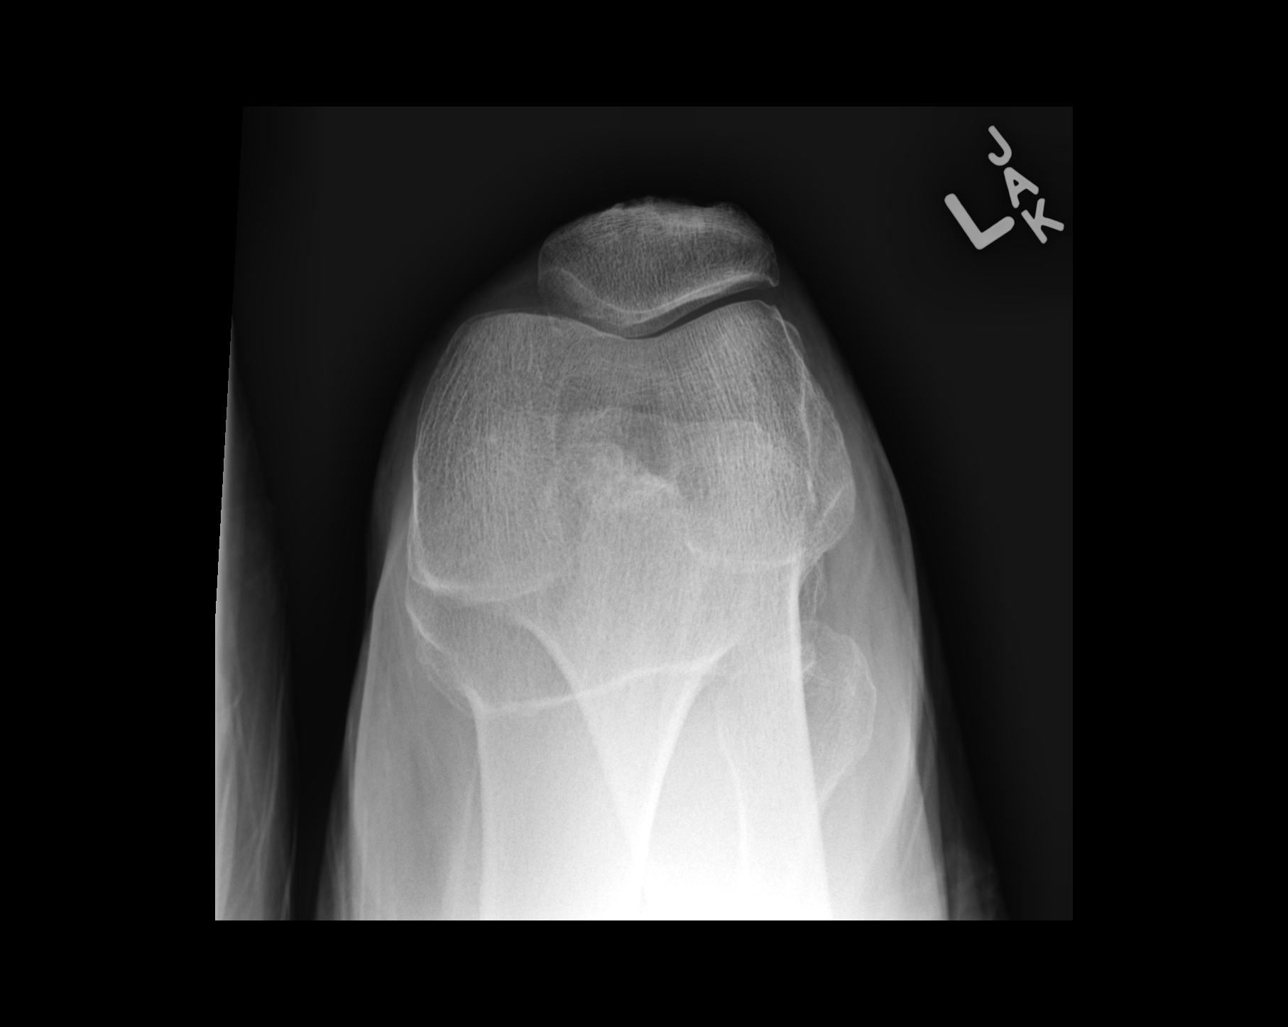

[4 of 4 positions shown; findings below may reference images not displayed]

FINDINGS: Minimal degenerate change over the medial compartment and
patellofemoral joints. Mild chondrocalcinosis over the medial and
lateral compartments. No evidence of fracture or dislocation. No
significant joint effusion.
IMPRESSION: No acute findings.

Mild osteoarthritis.

## 2020-03-25 ENCOUNTER — Encounter (HOSPITAL_BASED_OUTPATIENT_CLINIC_OR_DEPARTMENT_OTHER): Payer: Self-pay

## 2020-03-25 ENCOUNTER — Other Ambulatory Visit: Payer: Self-pay

## 2020-03-25 ENCOUNTER — Observation Stay (HOSPITAL_BASED_OUTPATIENT_CLINIC_OR_DEPARTMENT_OTHER)
Admission: EM | Admit: 2020-03-25 | Discharge: 2020-03-26 | Disposition: A | Discharge status: Home / Self Care | Payer: Medicare HMO | Attending: Emergency Medicine | Admitting: Emergency Medicine

## 2020-03-25 ENCOUNTER — Emergency Department (HOSPITAL_BASED_OUTPATIENT_CLINIC_OR_DEPARTMENT_OTHER): Payer: Medicare HMO

## 2020-03-25 DIAGNOSIS — I10 Essential (primary) hypertension: Secondary | ICD-10-CM | POA: Insufficient documentation

## 2020-03-25 DIAGNOSIS — K819 Cholecystitis, unspecified: Secondary | ICD-10-CM

## 2020-03-25 DIAGNOSIS — J45909 Unspecified asthma, uncomplicated: Secondary | ICD-10-CM | POA: Insufficient documentation

## 2020-03-25 DIAGNOSIS — K810 Acute cholecystitis: Principal | ICD-10-CM | POA: Insufficient documentation

## 2020-03-25 DIAGNOSIS — Z20822 Contact with and (suspected) exposure to covid-19: Secondary | ICD-10-CM | POA: Insufficient documentation

## 2020-03-25 DIAGNOSIS — Z79899 Other long term (current) drug therapy: Secondary | ICD-10-CM | POA: Diagnosis not present

## 2020-03-25 DIAGNOSIS — R109 Unspecified abdominal pain: Secondary | ICD-10-CM | POA: Diagnosis present

## 2020-03-25 DIAGNOSIS — F411 Generalized anxiety disorder: Secondary | ICD-10-CM | POA: Insufficient documentation

## 2020-03-25 LAB — COMPREHENSIVE METABOLIC PANEL
ALT: 18 U/L (ref 0–44)
AST: 24 U/L (ref 15–41)
Albumin: 3.8 g/dL (ref 3.5–5.0)
Alkaline Phosphatase: 61 U/L (ref 38–126)
Anion gap: 11 (ref 5–15)
BUN: 34 mg/dL — ABNORMAL HIGH (ref 8–23)
CO2: 23 mmol/L (ref 22–32)
Calcium: 9.8 mg/dL (ref 8.9–10.3)
Chloride: 101 mmol/L (ref 98–111)
Creatinine, Ser: 1.11 mg/dL — ABNORMAL HIGH (ref 0.44–1.00)
GFR calc Af Amer: 55 mL/min — ABNORMAL LOW (ref >60)
GFR calc non Af Amer: 47 mL/min — ABNORMAL LOW (ref >60)
Glucose, Bld: 115 mg/dL — ABNORMAL HIGH (ref 70–99)
Potassium: 4.1 mmol/L (ref 3.5–5.1)
Sodium: 135 mmol/L (ref 135–145)
Total Bilirubin: 0.4 mg/dL (ref 0.3–1.2)
Total Protein: 6.9 g/dL (ref 6.5–8.1)

## 2020-03-25 LAB — SARS CORONAVIRUS 2 BY RT PCR (HOSPITAL ORDER, PERFORMED IN ~~LOC~~ HOSPITAL LAB): SARS Coronavirus 2: NEGATIVE

## 2020-03-25 LAB — URINALYSIS, ROUTINE W REFLEX MICROSCOPIC
Bilirubin Urine: NEGATIVE
Glucose, UA: NEGATIVE mg/dL
Ketones, ur: NEGATIVE mg/dL
Leukocytes,Ua: NEGATIVE
Nitrite: NEGATIVE
Protein, ur: NEGATIVE mg/dL
Specific Gravity, Urine: 1.025 (ref 1.005–1.030)
pH: 6 (ref 5.0–8.0)

## 2020-03-25 LAB — CBC
HCT: 37.1 % (ref 36.0–46.0)
Hemoglobin: 12.5 g/dL (ref 12.0–15.0)
MCH: 30.3 pg (ref 26.0–34.0)
MCHC: 33.7 g/dL (ref 30.0–36.0)
MCV: 89.8 fL (ref 80.0–100.0)
Platelets: 343 10*3/uL (ref 150–400)
RBC: 4.13 MIL/uL (ref 3.87–5.11)
RDW: 13.1 % (ref 11.5–15.5)
WBC: 7.5 10*3/uL (ref 4.0–10.5)
nRBC: 0 % (ref 0.0–0.2)

## 2020-03-25 LAB — URINALYSIS, MICROSCOPIC (REFLEX): WBC, UA: NONE SEEN WBC/hpf (ref 0–5)

## 2020-03-25 LAB — LIPASE, BLOOD: Lipase: 55 U/L — ABNORMAL HIGH (ref 11–51)

## 2020-03-25 MED ORDER — SODIUM CHLORIDE 0.9 % IV SOLN
2.0000 g | Freq: Once | INTRAVENOUS | Status: AC
  Administered 2020-03-25: 2 g via INTRAVENOUS
  Filled 2020-03-25: qty 20

## 2020-03-25 MED ORDER — SODIUM CHLORIDE 0.9 % IV SOLN
1000.0000 mL | INTRAVENOUS | Status: DC
  Administered 2020-03-25: 1000 mL via INTRAVENOUS

## 2020-03-25 MED ORDER — ONDANSETRON HCL 4 MG/2ML IJ SOLN: 4.0000 mg | Freq: Four times a day (QID) | INTRAMUSCULAR | Status: DC | PRN

## 2020-03-25 MED ORDER — DIPHENHYDRAMINE HCL 12.5 MG/5ML PO ELIX: 12.5000 mg | ORAL_SOLUTION | Freq: Four times a day (QID) | ORAL | Status: DC | PRN

## 2020-03-25 MED ORDER — ONDANSETRON HCL 4 MG/2ML IJ SOLN: 4.0000 mg | Freq: Once | INTRAMUSCULAR | Status: DC

## 2020-03-25 MED ORDER — METOPROLOL TARTRATE 5 MG/5ML IV SOLN: 5.0000 mg | Freq: Four times a day (QID) | INTRAVENOUS | Status: DC | PRN

## 2020-03-25 MED ORDER — ZOLPIDEM TARTRATE 5 MG PO TABS: 5.0000 mg | ORAL_TABLET | Freq: Every evening | ORAL | Status: DC | PRN

## 2020-03-25 MED ORDER — FENTANYL CITRATE (PF) 100 MCG/2ML IJ SOLN
50.0000 ug | INTRAMUSCULAR | Status: DC | PRN
  Filled 2020-03-25: qty 2

## 2020-03-25 MED ORDER — SODIUM CHLORIDE 0.9 % IV BOLUS (SEPSIS)
500.0000 mL | Freq: Once | INTRAVENOUS | Status: AC
  Administered 2020-03-25: 500 mL via INTRAVENOUS

## 2020-03-25 MED ORDER — MORPHINE SULFATE (PF) 4 MG/ML IV SOLN: 4.0000 mg | INTRAVENOUS | Status: DC | PRN

## 2020-03-25 MED ORDER — DIPHENHYDRAMINE HCL 50 MG/ML IJ SOLN: 12.5000 mg | Freq: Four times a day (QID) | INTRAMUSCULAR | Status: DC | PRN

## 2020-03-25 MED ORDER — DEXTROSE-NACL 5-0.9 % IV SOLN: INTRAVENOUS | Status: DC

## 2020-03-25 MED ORDER — SIMETHICONE 80 MG PO CHEW
40.0000 mg | CHEWABLE_TABLET | Freq: Four times a day (QID) | ORAL | Status: DC | PRN
  Filled 2020-03-25: qty 1

## 2020-03-25 MED ORDER — SODIUM CHLORIDE 0.9 % IV SOLN: 2.0000 g | INTRAVENOUS | Status: DC

## 2020-03-25 MED ORDER — ONDANSETRON 4 MG PO TBDP: 4.0000 mg | ORAL_TABLET | Freq: Four times a day (QID) | ORAL | Status: DC | PRN

## 2020-03-25 MED ORDER — ENOXAPARIN SODIUM 40 MG/0.4ML ~~LOC~~ SOLN
40.0000 mg | SUBCUTANEOUS | Status: DC
  Administered 2020-03-26: 40 mg via SUBCUTANEOUS
  Filled 2020-03-25: qty 0.4

## 2020-03-25 MED ORDER — IOHEXOL 300 MG/ML  SOLN
100.0000 mL | Freq: Once | INTRAMUSCULAR | Status: AC | PRN
  Administered 2020-03-25: 100 mL via INTRAVENOUS

## 2020-03-25 MED ORDER — MORPHINE SULFATE (PF) 4 MG/ML IV SOLN: 4.0000 mg | Freq: Once | INTRAVENOUS | Status: DC

## 2020-03-25 NOTE — ED Notes (Signed)
Report given to Ben RN with [facility]  

## 2020-03-25 NOTE — ED Provider Notes (Signed)
[facility] EMERGENCY DEPARTMENT Provider Note   CSN: [id number] Arrival date & time: 03/25/20  1506     History Chief Complaint  Patient presents with  . Abdominal Pain    Margaret Wilkins is a 79 y.o. female.  HPI Patient reports he has been feeling very well recently.  She got up this morning and had breakfast.  Shortly after breakfast she started to get pain in her right upper abdomen.  She reports it was fairly severe but she thought if she went out and did some shopping she might feel better with some activity.  She went out with her daughter and tried to eat a little potato soup around lunchtime and pain continued to worsen.  Pain is deep and aching and goes from her right upper abdomen to her back.  She reports she became intensely nauseated but did not vomit.  No recent diarrhea or constipation.  No fevers no chills.  She has been feeling well.  No history of similar symptoms.  She does have history of ulcerative colitis.  No history of kidney stones.  Patient has been vaccinated for Covid.    Past Medical History:  Diagnosis Date  . Asthma   . Generalized anxiety disorder   . Hypertension   . Ulcerative colitis [facility])     Patient Active Problem List   Diagnosis Date Noted  . Tear of right supraspinatus tendon 12/23/2017  . Pain in joint, shoulder region 12/20/2017  . Osteopenia 12/20/2017  . Right hip pain 08/04/2016  . Bilateral knee pain 08/04/2016    Past Surgical History:  Procedure Laterality Date  . BACK SURGERY       OB History   No obstetric history on file.     No family history on file.  Social History   Tobacco Use  . Smoking status: Never Smoker  . Smokeless tobacco: Never Used  Vaping Use  . Vaping Use: Never used  Substance Use Topics  . Alcohol use: No  . Drug use: Never    Home Medications Prior to Admission medications   Medication Sig Start Date End Date Taking? Authorizing Provider  diclofenac sodium (VOLTAREN) 1 % GEL  Apply 2 g topically 4 (four) times daily. 12/23/17   McKeag, Janine Ores, MD  estrogens conjugated, synthetic A, (CENESTIN) 0.3 MG tablet Take 0.3 mg by mouth daily.    [provider]  Fluticasone Furoate-Vilanterol (BREO ELLIPTA IN) Inhale into the lungs.    [provider]  losartan (COZAAR) 50 MG tablet Take 50 mg by mouth daily.    [provider]  SIMBRINZA 1-0.2 % SUSP INSTILL 1 DROP INTO EACH EYE TWICE DAILY 12/14/17   [provider]    Allergies    Patient has no known allergies.  Review of Systems   Review of Systems 10 systems reviewed and negative except as per HPI Physical Exam Updated Vital Signs BP (!) 159/90 (BP Location: Left Arm)   Pulse 86   Temp 97.6 F (36.4 C) (Oral)   Resp 18   Ht 5\' 4"  (1.626 m)   Wt 64.4 kg   SpO2 99%   BMI 24.37 kg/m   Physical Exam Constitutional:      Comments: Alert and nontoxic.  Will nourished and well developed.  Normal mental status.  No respiratory distress.  Patient appears to be in pain.  HENT:     Head: Normocephalic and atraumatic.     Mouth/Throat:     Pharynx: Oropharynx  is clear.  Eyes:     Extraocular Movements: Extraocular movements intact.     Conjunctiva/sclera: Conjunctivae normal.  Cardiovascular:     Rate and Rhythm: Normal rate and regular rhythm.  Pulmonary:     Effort: Pulmonary effort is normal.     Breath sounds: Normal breath sounds.  Abdominal:     Comments: Abdomen has involuntary guarding right upper quadrant.  Epigastric pain to palpation.  Lower abdomen nontender.  Musculoskeletal:        General: No swelling or tenderness. Normal range of motion.     Cervical back: Neck supple.     Right lower leg: No edema.     Left lower leg: No edema.  Skin:    General: Skin is warm and dry.  Neurological:     General: No focal deficit present.     Mental Status: She is oriented to person, place, and time.     Coordination: Coordination normal.  Psychiatric:        Mood  and Affect: Mood normal.     ED Results / Procedures / Treatments   Labs (all labs ordered are listed, but only abnormal results are displayed) Labs Reviewed  LIPASE, BLOOD - Abnormal; Notable for the following components:      Result Value   Lipase 55 (*)    All other components within normal limits  COMPREHENSIVE METABOLIC PANEL - Abnormal; Notable for the following components:   Glucose, Bld 115 (*)    BUN 34 (*)    Creatinine, Ser 1.11 (*)    GFR calc non Af Amer 47 (*)    GFR calc Af Amer 55 (*)    All other components within normal limits  URINALYSIS, ROUTINE W REFLEX MICROSCOPIC - Abnormal; Notable for the following components:   Hgb urine dipstick TRACE (*)    All other components within normal limits  URINALYSIS, MICROSCOPIC (REFLEX) - Abnormal; Notable for the following components:   Bacteria, UA RARE (*)    All other components within normal limits  SARS CORONAVIRUS 2 BY RT PCR (HOSPITAL ORDER, PERFORMED IN [facility])  CBC  URINALYSIS, ROUTINE W REFLEX MICROSCOPIC    EKG EKG Interpretation  Date/Time:  Tuesday March 25 2020 15:22:31 EDT Ventricular Rate:  60 PR Interval:  156 QRS Duration: 72 QT Interval:  404 QTC Calculation: 404 R Axis:   8 Text Interpretation: Normal sinus rhythm with sinus arrhythmia Normal ECG normal, noold comparison Confirmed by Arby Barrette [phone]) on 03/25/2020 3:31:53 PM   Radiology CT Abdomen Pelvis W Contrast  Result Date: 03/25/2020 CLINICAL DATA:  Right upper quadrant abdominal pain, diarrhea EXAM: CT ABDOMEN AND PELVIS WITH CONTRAST TECHNIQUE: Multidetector CT imaging of the abdomen and pelvis was performed using the standard protocol following bolus administration of intravenous contrast. CONTRAST:  OMNIPAQUE IOHEXOL 300 MG/ML  SOLN COMPARISON:  10/27/2015 FINDINGS: Lower chest: Peribronchial nodularity within the posterior basal right lower lobe has progressed in the interval since the prior  examination most in keeping with chronic mycobacterial or fungal infection. 6 mm subpleural pulmonary nodule within the left lung base is stable since prior examination and safely considered benign. The visualized heart and pericardium are unremarkable. Small hiatal hernia present. Hepatobiliary: The gallbladder is distended, small dependently layering sludge or stones is seen at the gallbladder neck (axial image # 24, coronal image # 42) and there is trace pericholecystic inflammatory stranding, all suspicious for changes of early acute calculus cholecystitis. Liver is unremarkable. No intra  or extrahepatic biliary ductal dilation. Pancreas: Unremarkable Spleen: Unremarkable Adrenals/Urinary Tract: Adrenal glands are unremarkable. Kidneys are normal, without renal calculi, focal lesion, or hydronephrosis. Bladder is unremarkable. Stomach/Bowel: Stomach, and small bowel are unremarkable. There is severe pancolonic diverticulosis without superimposed inflammatory change. The large bowel is otherwise unremarkable. Appendix normal. No free intraperitoneal gas or fluid. Vascular/Lymphatic: No pathologic adenopathy within the abdomen and pelvis. The abdominal vasculature is age-appropriate with mild aortoiliac atherosclerotic calcification. No aneurysm. Reproductive: Status post hysterectomy. No adnexal masses. Other: Tiny fat containing umbilical hernia. Right inguinal cord lipoma. Rectum unremarkable. Musculoskeletal: Degenerative changes are seen within the lumbar spine and hips bilaterally. No lytic or blastic bone lesions are seen. IMPRESSION: 1. Distended gallbladder with small dependently layering sludge or stones at the gallbladder neck and trace pericholecystic inflammatory stranding, all suspicious for early acute calculus cholecystitis. Correlation with liver enzymes is recommended. 2. Severe pancolonic diverticulosis without superimposed inflammatory change. 3. Peribronchial nodularity within the posterior  basal right lower lobe has progressed in the interval since the prior examination most in keeping with chronic mycobacterial or fungal infection. Aortic Atherosclerosis (ICD10-I70.0). Electronically Signed   By: Helyn Numbers MD   On: 03/25/2020 18:37    Procedures Procedures (including critical care time)  Medications Ordered in ED Medications  sodium chloride 0.9 % bolus 500 mL (0 mLs Intravenous Stopped 03/25/20 1951)  iohexol (OMNIPAQUE) 300 MG/ML solution 100 mL (100 mLs Intravenous Contrast Given 03/25/20 1800)  cefTRIAXone (ROCEPHIN) 2 g in sodium chloride 0.9 % 100 mL IVPB (0 g Intravenous Stopped 03/25/20 2035)    ED Course  I have reviewed the triage vital signs and the nursing notes.  Pertinent labs & imaging results that were available during my care of the patient were reviewed by me and considered in my medical decision making (see chart for details).  Clinical Course as of Mar 25 2037  Tue Mar 25, 2020  2000 Consult: Reviewed with Dr. Luisa Hart general surgery.  Request patient be transferred to [facility] long emergency department with anticipated surgery tomorrow morning.  Patient may have some liquids tonight.   [MP]    Clinical Course User Index [MP] Arby Barrette, MD   MDM Rules/Calculators/A&P                         Patient presents as outlined above.  She is very well at baseline.  She has not been sick recently.  Minimal medical comorbidities.  Findings are very suggestive of biliary colic or cholecystitis.  CT shows early inflammatory changes of the gallbladder with distention.  Patient started on IV fluids and Rocephin.  Morphine for pain and Zofran for nausea.  Dr. Luisa Hart excepts for admission.  Advises patient may have something to drink this evening and anticipated surgery tomorrow morning.  Request patient be transferred to [facility] emergency department for evaluation. Final Clinical Impression(s) / ED Diagnoses Final diagnoses:  Cholecystitis    Rx /  DC Orders ED Discharge Orders    None       Arby Barrette, MD 03/25/20 2039

## 2020-03-25 NOTE — ED Provider Notes (Signed)
Blood pressure (!) 156/76, pulse 83, temperature 97.6 F (36.4 C), temperature source Oral, resp. rate 18, height 5\' 4"  (1.626 m), weight 64.4 kg, SpO2 99 %.  In short, Margaret Wilkins is a 79 y.o. female with a chief complaint of Abdominal Pain .  Refer to the original H&P for additional details.  10:20 PM  Evaluated patient after arrival to [facility]. Made Surgery team aware that patient has arrived. Released sign and held orders.   Discussed patient's case with Surgery, [facility]. [facility] to request admission. Patient and family (if present) updated with plan. Care transferred to Surgery service.  I reviewed all nursing notes, vitals, pertinent old records, EKGs, labs, imaging (as available).     Luisa Hart, MD 03/25/20 2224

## 2020-03-25 NOTE — ED Notes (Signed)
ED Provider at bedside. 

## 2020-03-25 NOTE — ED Notes (Signed)
Lab reports stool in urine sample-needs recollect

## 2020-03-25 NOTE — ED Notes (Signed)
No available [place]

## 2020-03-25 NOTE — ED Triage Notes (Signed)
Pt c/o RUQ pain started ~9am-states pain worse after eating-diarrhea x 1 hour-NAD-to triage in w/c

## 2020-03-25 NOTE — ED Notes (Signed)
Report called to charge nurse at [facility] 

## 2020-03-25 NOTE — ED Notes (Signed)
Pt states that she takes a probiotic daily to help with her colitis symptoms.  She would like a probiotic while in the hospital to prevent any issues with colitis.  Pt states that she takes a probiotic from "[employer]B".  She states that since taking this daily she has not had any issues with colitis and she is very concerned about a relapse of ulcerative colitis if she does not continue this. Advised her to have family bring this supplement in so that she may continue taking this with MD permission in case we do not carry anything comparable.

## 2020-03-26 ENCOUNTER — Encounter (HOSPITAL_COMMUNITY): Payer: Self-pay | Admitting: Surgery

## 2020-03-26 ENCOUNTER — Encounter (HOSPITAL_COMMUNITY)
Admission: EM | Disposition: A | Discharge status: Home / Self Care | Payer: Self-pay | Source: Home / Self Care | Attending: Emergency Medicine

## 2020-03-26 ENCOUNTER — Inpatient Hospital Stay (HOSPITAL_COMMUNITY): Payer: Medicare HMO | Admitting: Anesthesiology

## 2020-03-26 DIAGNOSIS — J45909 Unspecified asthma, uncomplicated: Secondary | ICD-10-CM | POA: Diagnosis not present

## 2020-03-26 DIAGNOSIS — Z20822 Contact with and (suspected) exposure to covid-19: Secondary | ICD-10-CM | POA: Diagnosis not present

## 2020-03-26 DIAGNOSIS — K810 Acute cholecystitis: Secondary | ICD-10-CM | POA: Diagnosis not present

## 2020-03-26 DIAGNOSIS — I10 Essential (primary) hypertension: Secondary | ICD-10-CM | POA: Diagnosis not present

## 2020-03-26 HISTORY — PX: CHOLECYSTECTOMY: SHX55

## 2020-03-26 LAB — COMPREHENSIVE METABOLIC PANEL
ALT: 17 U/L (ref 0–44)
AST: 21 U/L (ref 15–41)
Albumin: 3.4 g/dL — ABNORMAL LOW (ref 3.5–5.0)
Alkaline Phosphatase: 53 U/L (ref 38–126)
Anion gap: 6 (ref 5–15)
BUN: 26 mg/dL — ABNORMAL HIGH (ref 8–23)
CO2: 23 mmol/L (ref 22–32)
Calcium: 8.9 mg/dL (ref 8.9–10.3)
Chloride: 109 mmol/L (ref 98–111)
Creatinine, Ser: 1.09 mg/dL — ABNORMAL HIGH (ref 0.44–1.00)
GFR calc Af Amer: 56 mL/min — ABNORMAL LOW (ref >60)
GFR calc non Af Amer: 48 mL/min — ABNORMAL LOW (ref >60)
Glucose, Bld: 129 mg/dL — ABNORMAL HIGH (ref 70–99)
Potassium: 3.9 mmol/L (ref 3.5–5.1)
Sodium: 138 mmol/L (ref 135–145)
Total Bilirubin: 0.6 mg/dL (ref 0.3–1.2)
Total Protein: 6.3 g/dL — ABNORMAL LOW (ref 6.5–8.1)

## 2020-03-26 LAB — CBC
HCT: 35.9 % — ABNORMAL LOW (ref 36.0–46.0)
Hemoglobin: 11.7 g/dL — ABNORMAL LOW (ref 12.0–15.0)
MCH: 30 pg (ref 26.0–34.0)
MCHC: 32.6 g/dL (ref 30.0–36.0)
MCV: 92.1 fL (ref 80.0–100.0)
Platelets: 314 10*3/uL (ref 150–400)
RBC: 3.9 MIL/uL (ref 3.87–5.11)
RDW: 13.2 % (ref 11.5–15.5)
WBC: 7.3 10*3/uL (ref 4.0–10.5)
nRBC: 0 % (ref 0.0–0.2)

## 2020-03-26 SURGERY — LAPAROSCOPIC CHOLECYSTECTOMY
Anesthesia: General | Site: Abdomen

## 2020-03-26 MED ORDER — FENTANYL CITRATE (PF) 250 MCG/5ML IJ SOLN
INTRAMUSCULAR | Status: DC | PRN
Start: 2020-03-26 — End: 2020-03-26
  Administered 2020-03-26: 100 ug via INTRAVENOUS
  Administered 2020-03-26 (×3): 50 ug via INTRAVENOUS

## 2020-03-26 MED ORDER — BUPIVACAINE-EPINEPHRINE (PF) 0.25% -1:200000 IJ SOLN
INTRAMUSCULAR | Status: AC
  Filled 2020-03-26: qty 30

## 2020-03-26 MED ORDER — LACTATED RINGERS IV SOLN: INTRAVENOUS | Status: DC

## 2020-03-26 MED ORDER — FENTANYL CITRATE (PF) 100 MCG/2ML IJ SOLN
INTRAMUSCULAR | Status: DC
Start: 2020-03-26 — End: 2020-03-26
  Filled 2020-03-26: qty 2

## 2020-03-26 MED ORDER — DEXAMETHASONE SODIUM PHOSPHATE 10 MG/ML IJ SOLN
INTRAMUSCULAR | Status: DC | PRN
  Administered 2020-03-26: 4 mg via INTRAVENOUS

## 2020-03-26 MED ORDER — BUPIVACAINE-EPINEPHRINE 0.25% -1:200000 IJ SOLN
INTRAMUSCULAR | Status: DC | PRN
  Administered 2020-03-26: 20 mL

## 2020-03-26 MED ORDER — FENTANYL CITRATE (PF) 100 MCG/2ML IJ SOLN
25.0000 ug | INTRAMUSCULAR | Status: DC | PRN
  Administered 2020-03-26 (×2): 25 ug via INTRAVENOUS

## 2020-03-26 MED ORDER — LIDOCAINE 2% (20 MG/ML) 5 ML SYRINGE
INTRAMUSCULAR | Status: AC
  Filled 2020-03-26: qty 5

## 2020-03-26 MED ORDER — ROCURONIUM BROMIDE 10 MG/ML (PF) SYRINGE
PREFILLED_SYRINGE | INTRAVENOUS | Status: AC
  Filled 2020-03-26: qty 10

## 2020-03-26 MED ORDER — ONDANSETRON HCL 4 MG/2ML IJ SOLN
INTRAMUSCULAR | Status: DC | PRN
  Administered 2020-03-26: 4 mg via INTRAVENOUS

## 2020-03-26 MED ORDER — LIDOCAINE 2% (20 MG/ML) 5 ML SYRINGE
INTRAMUSCULAR | Status: DC | PRN
  Administered 2020-03-26: 60 mg via INTRAVENOUS

## 2020-03-26 MED ORDER — ROCURONIUM BROMIDE 10 MG/ML (PF) SYRINGE
PREFILLED_SYRINGE | INTRAVENOUS | Status: DC | PRN
  Administered 2020-03-26: 50 mg via INTRAVENOUS

## 2020-03-26 MED ORDER — ONDANSETRON HCL 4 MG/2ML IJ SOLN
INTRAMUSCULAR | Status: AC
  Filled 2020-03-26: qty 2

## 2020-03-26 MED ORDER — CHLORHEXIDINE GLUCONATE 0.12 % MT SOLN
15.0000 mL | OROMUCOSAL | Status: AC
  Administered 2020-03-26: 15 mL via OROMUCOSAL

## 2020-03-26 MED ORDER — TRAMADOL HCL 50 MG PO TABS: 50.0000 mg | ORAL_TABLET | Freq: Four times a day (QID) | ORAL | 0 refills | Status: AC | PRN

## 2020-03-26 MED ORDER — FENTANYL CITRATE (PF) 250 MCG/5ML IJ SOLN
INTRAMUSCULAR | Status: AC
  Filled 2020-03-26: qty 5

## 2020-03-26 MED ORDER — PROPOFOL 10 MG/ML IV BOLUS
INTRAVENOUS | Status: DC | PRN
  Administered 2020-03-26: 120 mg via INTRAVENOUS

## 2020-03-26 MED ORDER — ONDANSETRON HCL 4 MG/2ML IJ SOLN: 4.0000 mg | Freq: Once | INTRAMUSCULAR | Status: DC | PRN

## 2020-03-26 MED ORDER — DEXAMETHASONE SODIUM PHOSPHATE 10 MG/ML IJ SOLN
INTRAMUSCULAR | Status: AC
  Filled 2020-03-26: qty 1

## 2020-03-26 MED ORDER — LACTATED RINGERS IR SOLN
Status: DC | PRN
  Administered 2020-03-26: 1000 mL

## 2020-03-26 MED ORDER — EPHEDRINE SULFATE-NACL 50-0.9 MG/10ML-% IV SOSY
PREFILLED_SYRINGE | INTRAVENOUS | Status: DC | PRN
  Administered 2020-03-26: 10 mg via INTRAVENOUS

## 2020-03-26 MED ORDER — SUGAMMADEX SODIUM 200 MG/2ML IV SOLN
INTRAVENOUS | Status: DC | PRN
  Administered 2020-03-26: 200 mg via INTRAVENOUS

## 2020-03-26 MED ORDER — LACTATED RINGERS IV SOLN: INTRAVENOUS | Status: DC | PRN

## 2020-03-26 MED ORDER — ACETAMINOPHEN 500 MG PO TABS
1000.0000 mg | ORAL_TABLET | Freq: Once | ORAL | Status: AC
  Administered 2020-03-26: 1000 mg via ORAL
  Filled 2020-03-26: qty 2

## 2020-03-26 MED ORDER — PROPOFOL 10 MG/ML IV BOLUS
INTRAVENOUS | Status: AC
  Filled 2020-03-26: qty 20

## 2020-03-26 SURGICAL SUPPLY — 40 items
APPLICATOR ARISTA FLEXITIP XL (MISCELLANEOUS) IMPLANT
APPLIER CLIP 5 13 M/L LIGAMAX5 (MISCELLANEOUS) ×3
APPLIER CLIP ROT 10 11.4 M/L (STAPLE)
CABLE HIGH FREQUENCY MONO STRZ (ELECTRODE) ×3 IMPLANT
CHLORAPREP W/TINT 26 (MISCELLANEOUS) ×3 IMPLANT
CLIP APPLIE 5 13 M/L LIGAMAX5 (MISCELLANEOUS) ×1 IMPLANT
CLIP APPLIE ROT 10 11.4 M/L (STAPLE) IMPLANT
COVER MAYO STAND STRL (DRAPES) IMPLANT
COVER SURGICAL LIGHT HANDLE (MISCELLANEOUS) ×3 IMPLANT
COVER WAND RF STERILE (DRAPES) IMPLANT
DECANTER SPIKE VIAL GLASS SM (MISCELLANEOUS) ×3 IMPLANT
DERMABOND ADVANCED (GAUZE/BANDAGES/DRESSINGS) ×2
DERMABOND ADVANCED .7 DNX12 (GAUZE/BANDAGES/DRESSINGS) ×1 IMPLANT
DISSECTOR BLUNT TIP ENDO 5MM (MISCELLANEOUS) IMPLANT
DRAPE C-ARM 42X120 X-RAY (DRAPES) IMPLANT
ELECT PENCIL ROCKER SW 15FT (MISCELLANEOUS) ×3 IMPLANT
ELECT REM PT RETURN 15FT ADLT (MISCELLANEOUS) ×3 IMPLANT
GLOVE BIO SURGEON STRL SZ7.5 (GLOVE) ×3 IMPLANT
GLOVE INDICATOR 8.0 STRL GRN (GLOVE) ×3 IMPLANT
GOWN STRL REUS W/TWL XL LVL3 (GOWN DISPOSABLE) ×6 IMPLANT
GRASPER SUT TROCAR 14GX15 (MISCELLANEOUS) IMPLANT
HEMOSTAT ARISTA ABSORB 3G PWDR (HEMOSTASIS) IMPLANT
HEMOSTAT SNOW SURGICEL 2X4 (HEMOSTASIS) IMPLANT
KIT BASIN OR (CUSTOM PROCEDURE TRAY) ×3 IMPLANT
NEEDLE INSUFFLATION 14GA 120MM (NEEDLE) IMPLANT
POUCH SPECIMEN RETRIEVAL 10MM (ENDOMECHANICALS) ×3 IMPLANT
SCISSORS LAP 5X35 DISP (ENDOMECHANICALS) ×3 IMPLANT
SET CHOLANGIOGRAPH MIX (MISCELLANEOUS) IMPLANT
SET IRRIG TUBING LAPAROSCOPIC (IRRIGATION / IRRIGATOR) ×3 IMPLANT
SET TUBE SMOKE EVAC HIGH FLOW (TUBING) ×3 IMPLANT
SLEEVE ADV FIXATION 5X100MM (TROCAR) ×6 IMPLANT
SUT MNCRL AB 4-0 PS2 18 (SUTURE) ×3 IMPLANT
SYR 10ML ECCENTRIC (SYRINGE) ×3 IMPLANT
TOWEL OR 17X26 10 PK STRL BLUE (TOWEL DISPOSABLE) ×3 IMPLANT
TOWEL OR NON WOVEN STRL DISP B (DISPOSABLE) IMPLANT
TRAY LAPAROSCOPIC (CUSTOM PROCEDURE TRAY) ×3 IMPLANT
TROCAR ADV FIXATION 12X100MM (TROCAR) IMPLANT
TROCAR ADV FIXATION 5X100MM (TROCAR) ×3 IMPLANT
TROCAR XCEL BLUNT TIP 100MML (ENDOMECHANICALS) ×3 IMPLANT
TROCAR XCEL NON-BLD 11X100MML (ENDOMECHANICALS) IMPLANT

## 2020-03-26 NOTE — Op Note (Signed)
03/26/2020 11:13 AM  PATIENT: Margaret Wilkins  79 y.o. female  Patient Care Team: Manfred Shirts, PA as PCP - General (General Practice)  PRE-OPERATIVE DIAGNOSIS: Acute cholecystitis  POST-OPERATIVE DIAGNOSIS: Same  PROCEDURE: Laparoscopic cholecystectomy  SURGEON: Sharon Mt. Kaelei Wheeler, MD  ASSISTANT: OR staff  ANESTHESIA: General endotracheal  EBL: Total I/O In: 1000 [I.V.:1000] Out: 5 [Blood:5]  DRAINS: None  SPECIMEN: Gallbladder  COUNTS: Sponge, needle and instrument counts were reported correct x2 at the conclusion of the operation  DISPOSITION: PACU in satisfactory condition  COMPLICATIONS: None  FINDINGS: Acutely inflamed, edematous gallbladder consistent with acute cholecystitis.  Critical view of safety was obtained prior to clipping or dividing any structures.  INDICATION: Margaret Wilkins is a very pleasant 79 year old female who presented overnight to the emergency department for evaluation of abdominal pain that began earlier in the day.  This was primarily in the right upper quadrant.  Associated with radiation to her back.  She never had this kind of pain before.  She underwent evaluation and was found to have tenderness in the right upper quadrant.  LFTs were normal.  CT scan was obtained that demonstrated findings consistent with acute cholecystitis with pericholecystic edema and stranding as well as stones and sludge.  She was started on antibiotics.  We met with her.  After seeing and evaluating her, we discussed options going forward and she opted to pursue surgery.  Please refer to notes elsewhere for details regarding these discussions.  DESCRIPTION:  The patient was identified & brought into the operating room.  She was then positioned supine on the OR table. SCDs were in place and active during the entire case.  She then underwent general endotracheal anesthesia. Pressure points were padded. Hair on the abdomen was clipped by the OR team. The abdomen was prepped  and draped in the standard sterile fashion. Antibiotics were administered. A surgical timeout was performed and confirmed our plan.   A periumbilical incision was made. The umbilical stalk was grasped and retracted outwardly. The infraumbilical fascia was identified and incised. The peritoneal cavity was gently entered bluntly. A purse-string 0 Vicryl suture was placed. The Hasson cannula was inserted into the peritoneal cavity and insufflation with CO2 commenced to 79mHg. A laparoscope was inserted into the peritoneal cavity and inspection confirmed no evidence of trocar site complications. The patient was then positioned in reverse Trendelenburg with slight left side down. 3 additional 531mtrocars were placed along the right subcostal line - one 61m20mort in mid subcostal region, another 61mm84mrt in the right flank near the anterior axillary line, and a third 61mm 51mt in the left subxiphoid region obliquely near the falciform ligament.  The liver and gallbladder were inspected.  The gallbladder was acutely inflamed in appearance with edema and some omental adhesions.  These were carefully taken down with electrocautery.  The gallbladder was tense and unable to be grasped without aspirating.  Using a Nezhat, the gallbladder was decompressed at the fundus. The gallbladder fundus was grasped and elevated cephalad. An additional grasper was then placed on the infundibulum of the gallbladder and the infundibulum was retracted laterally. Staying high on the gallbladder, the peritoneum on both sides of the gallbladder was opened with hook cautery. Gentle blunt dissection was then employed with a MarylIT consultanting down into CalotCapital One cystic duct was identified and carefully circumferentially dissected. The cystic artery was also identified and carefully circumferentially dissected. The space between the cystic artery and hepatocystic plate was developed such  that a good view of the liver could be  seen through a window medial to the cystic artery. The triangle of Calot had been cleared of all fibrofatty tissue. At this point, a critical view of safety was achieved and the only structures visualized was the skeletonized cystic duct laterally, the skeletonized cystic artery and the liver through the window medial to the artery.  The cystic artery did branch at the level of the gallbladder into anterior and posterior divisions.  We had dissected both of these circumferentially and traced the posterior cystic from a posterior view and demonstrated that this went way up onto the gallbladder.  A cholangiogram was not performed at this point as her cystic duct was rather diminutive and there was concern for potential avulsion with attempts at passing a cholangiocatheter.  The cystic duct and artery were clipped with 2 clips on the patient side and 1 clip on the specimen side.  The posterior cystic branch was also controlled with 2 clips on the patient's side.  The cystic duct and artery were then divided. The gallbladder was then freed from its remaining attachments to the liver using electrocautery and placed into an endocatch bag. The RUQ was gently irrigated with sterile saline. Hemostasis was then verified. The clips were in good position; the gallbladder fossa was dry. The rest of the abdomen was inspected no injury nor bleeding elsewhere was identified.  The endocatch bag containing the gallbladder was then removed from the umbilical port site and passed off as specimen. The RUQ ports were removed under direct visualization and noted to be hemostatic. The umbilical fascia was then closed using the 0 Vicryl purse-string suture. The fascia was palpated and noted to be completely closed. The skin of all incision sites was approximated with 4-0 monocryl subcuticular suture and dermabond applied. She was then awakened from anesthesia, extubated, and transferred to a stretcher for transport to PACU in  satisfactory condition.

## 2020-03-26 NOTE — Discharge Instructions (Signed)
POST OP INSTRUCTIONS  1. DIET: As tolerated. Follow a light bland diet the first 24 hours after arrival home, such as soup, liquids, crackers, etc.  Be sure to include lots of fluids daily.  Avoid fast food or heavy meals as your are more likely to get nauseated.  Eat a low fat the next few days after surgery.  2. Take your usually prescribed home medications unless otherwise directed.  3. PAIN CONTROL: a. Pain is best controlled by a usual combination of three different methods TOGETHER: i. Ice/Heat ii. Over the counter pain medication iii. Prescription pain medication b. Most patients will experience some swelling and bruising around the surgical site.  Ice packs or heating pads (30-60 minutes up to 6 times a day) will help. Some people prefer to use ice alone, heat alone, alternating between ice & heat.  Experiment to what works for you.  Swelling and bruising can take several weeks to resolve.   c. It is helpful to take an over-the-counter pain medication regularly for the first few weeks: i. Ibuprofen (Motrin/Advil) - 200mg tabs - take 3 tabs (600mg) every 6 hours as needed for pain ii. Acetaminophen (Tylenol) - you may take 650mg every 6 hours as needed. You can take this with motrin as they act differently on the body. If you are taking a narcotic pain medication that has acetaminophen in it, do not take over the counter tylenol at the same time.  Iii. NOTE: You may take both of these medications together - most patients  find it most helpful when alternating between the two (i.e. Ibuprofen at 6am, tylenol at 9am, ibuprofen at 12pm ...) d. A  prescription for pain medication should be given to you upon discharge.  Take your pain medication as prescribed if your pain is not adequatly controlled with the over-the-counter pain reliefs mentioned above.  4. Avoid getting constipated.  Between the surgery and the pain medications, it is common to experience some constipation.  Increasing fluid  intake and taking a fiber supplement (such as Metamucil, Citrucel, FiberCon, MiraLax, etc) 1-2 times a day regularly will usually help prevent this problem from occurring.  A mild laxative (prune juice, Milk of Magnesia, MiraLax, etc) should be taken according to package directions if there are no bowel movements after 48 hours.    5. Dressing: Your incision are covered in Dermabond which is like sterile superglue for the skin. This will come off on it's own in a couple weeks. It is waterproof and you may bathe normally starting the day after your surgery in a shower. Avoid baths/pools/lakes/oceans until your wounds have fully healed.  6. ACTIVITIES as tolerated:   a. Avoid heavy lifting (>10lbs or 1 gallon of milk) for the next 6 weeks. b. You may resume regular (light) daily activities beginning the next day--such as daily self-care, walking, climbing stairs--gradually increasing activities as tolerated.  If you can walk 30 minutes without difficulty, it is safe to try more intense activity such as jogging, treadmill, bicycling, low-impact aerobics.  c. DO NOT PUSH THROUGH PAIN.  Let pain be your guide: If it hurts to do something, don't do it. d. You may drive when you are no longer taking prescription pain medication, you can comfortably wear a seatbelt, and you can safely maneuver your car and apply brakes.  7. FOLLOW UP in our office a. Please call [facility] at [phone] to set up an appointment to see your surgeon in the office for a follow-up appointment approximately   2 weeks after your surgery. b. Make sure that you call for this appointment the day you arrive home to insure a convenient appointment time.  9. If you have disability or family leave forms that need to be completed, you may have them completed by your primary care physician's office; for return to work instructions, please ask our office staff and they will be happy to assist you in obtaining this documentation   When to call us  [phone]: 1. Poor pain control 2. Reactions / problems with new medications (rash/itching, etc)  3. Fever over 101.5 F (38.5 C) 4. Inability to urinate 5. Nausea/vomiting 6. Worsening swelling or bruising 7. Continued bleeding from incision. 8. Increased pain, redness, or drainage from the incision  The clinic staff is available to answer your questions during regular business hours (8:30am-5pm).  Please don't hesitate to call and ask to speak to one of our nurses for clinical concerns.   A surgeon from [facility] is always on call at the hospitals   If you have a medical emergency, go to the nearest emergency room or call 911.  [facility] [address], [address], [place], [place]  [address] MAIN: [phone] FAX: [phone] [email or web address] 

## 2020-03-26 NOTE — Transfer of Care (Signed)
Immediate Anesthesia Transfer of Care Note  Patient: Margaret Wilkins  Procedure(s) Performed: Procedure(s): LAPAROSCOPIC CHOLECYSTECTOMY (N/A)  Patient Location: PACU  Anesthesia Type:General  Level of Consciousness: Alert, Awake, Oriented  Airway & Oxygen Therapy: Patient Spontanous Breathing  Post-op Assessment: Report given to RN  Post vital signs: Reviewed and stable  Last Vitals:  Vitals:   03/26/20 0822 03/26/20 0830  BP: (!) 152/60 (!) 175/68  Pulse: 76 80  Resp: 16 17  Temp: 36.6 C 36.6 C  SpO2: 98% 100%    Complications: No apparent anesthesia complications

## 2020-03-26 NOTE — Anesthesia Preprocedure Evaluation (Addendum)
Anesthesia Evaluation  Patient identified by MRN, date of birth, ID band Patient awake    Reviewed: Allergy & Precautions, NPO status , Patient's Chart, lab work & pertinent test results  Airway Mallampati: II  TM Distance: >3 FB Neck ROM: Full    Dental no notable dental hx. (+) Teeth Intact, Dental Advisory Given   Pulmonary asthma ,  Well controlled- last used today, has been worse with mask use per patient   Pulmonary exam normal breath sounds clear to auscultation       Cardiovascular hypertension, Pt. on medications Normal cardiovascular exam Rhythm:Regular Rate:Normal     Neuro/Psych PSYCHIATRIC DISORDERS Anxiety negative neurological ROS     GI/Hepatic Neg liver ROS, Cholelithiasis Hx UC   Endo/Other  negative endocrine ROS  Renal/GU Renal InsufficiencyRenal diseaseAKI, Cr 1.09  negative genitourinary   Musculoskeletal negative musculoskeletal ROS (+)   Abdominal   Peds  Hematology negative hematology ROS (+) hct 35.9, plt 314   Anesthesia Other Findings   Reproductive/Obstetrics negative OB ROS                         Anesthesia Physical Anesthesia Plan  ASA: II  Anesthesia Plan: General   Post-op Pain Management:    Induction: Intravenous  PONV Risk Score and Plan: 3 and Ondansetron, Dexamethasone and Treatment may vary due to age or medical condition  Airway Management Planned: Oral ETT  Additional Equipment: None  Intra-op Plan:   Post-operative Plan: Extubation in OR  Informed Consent: I have reviewed the patients History and Physical, chart, labs and discussed the procedure including the risks, benefits and alternatives for the proposed anesthesia with the patient or authorized representative who has indicated his/her understanding and acceptance.     Dental advisory given  Plan Discussed with: CRNA  Anesthesia Plan Comments:       Anesthesia Quick  Evaluation

## 2020-03-26 NOTE — Discharge Summary (Signed)
[facility] Discharge Summary   Patient ID: Margaret Wilkins MRN: [id number] DOB/AGE: November 03, 1940 79 y.o.  Admit date: 03/25/2020 Discharge date: 03/26/2020 Discharge Diagnosis Patient Active Problem List   Diagnosis Date Noted  . Acute cholecystitis 03/25/2020  . Tear of right supraspinatus tendon 12/23/2017  . Pain in joint, shoulder region 12/20/2017  . Osteopenia 12/20/2017  . Right hip pain 08/04/2016  . Bilateral knee pain 08/04/2016    Consultants None   Imaging: CT Abdomen Pelvis W Contrast  Result Date: 03/25/2020 CLINICAL DATA:  Right upper quadrant abdominal pain, diarrhea EXAM: CT ABDOMEN AND PELVIS WITH CONTRAST TECHNIQUE: Multidetector CT imaging of the abdomen and pelvis was performed using the standard protocol following bolus administration of intravenous contrast. CONTRAST:  OMNIPAQUE IOHEXOL 300 MG/ML  SOLN COMPARISON:  10/27/2015 FINDINGS: Lower chest: Peribronchial nodularity within the posterior basal right lower lobe has progressed in the interval since the prior examination most in keeping with chronic mycobacterial or fungal infection. 6 mm subpleural pulmonary nodule within the left lung base is stable since prior examination and safely considered benign. The visualized heart and pericardium are unremarkable. Small hiatal hernia present. Hepatobiliary: The gallbladder is distended, small dependently layering sludge or stones is seen at the gallbladder neck (axial image # 24, coronal image # 42) and there is trace pericholecystic inflammatory stranding, all suspicious for changes of early acute calculus cholecystitis. Liver is unremarkable. No intra or extrahepatic biliary ductal dilation. Pancreas: Unremarkable Spleen: Unremarkable Adrenals/Urinary Tract: Adrenal glands are unremarkable. Kidneys are normal, without renal calculi, focal lesion, or hydronephrosis. Bladder is unremarkable. Stomach/Bowel: Stomach, and small bowel are unremarkable. There is  severe pancolonic diverticulosis without superimposed inflammatory change. The large bowel is otherwise unremarkable. Appendix normal. No free intraperitoneal gas or fluid. Vascular/Lymphatic: No pathologic adenopathy within the abdomen and pelvis. The abdominal vasculature is age-appropriate with mild aortoiliac atherosclerotic calcification. No aneurysm. Reproductive: Status post hysterectomy. No adnexal masses. Other: Tiny fat containing umbilical hernia. Right inguinal cord lipoma. Rectum unremarkable. Musculoskeletal: Degenerative changes are seen within the lumbar spine and hips bilaterally. No lytic or blastic bone lesions are seen. IMPRESSION: 1. Distended gallbladder with small dependently layering sludge or stones at the gallbladder neck and trace pericholecystic inflammatory stranding, all suspicious for early acute calculus cholecystitis. Correlation with liver enzymes is recommended. 2. Severe pancolonic diverticulosis without superimposed inflammatory change. 3. Peribronchial nodularity within the posterior basal right lower lobe has progressed in the interval since the prior examination most in keeping with chronic mycobacterial or fungal infection. Aortic Atherosclerosis (ICD10-I70.0). Electronically Signed   By: Helyn Numbers MD   On: 03/25/2020 18:37    Procedures Dr. Cristal Deer white - Laparoscopic Cholecystectomy  Hospital Course:  79 y/o F who [facility] to [facility] with upper abdominal pain with radiation to her back. Workup showed acute cholecystitis.  Patient was admitted and underwent procedure listed above.  Tolerated procedure well and was transferred to the floor.  Diet was advanced as tolerated.  On POD#0, the patient was voiding well, tolerating diet, ambulating well, pain well controlled, vital signs stable, incisions c/d/i and felt stable for discharge home.  Patient will follow up in our office in 2-3 weeks and knows to call with questions or concerns.   Physical Exam: General:   Alert, NAD, pleasant, comfortable Abd:  Soft, ND, mild tenderness, incisions C/D/I,   Allergies as of 03/26/2020   No Known Allergies     Medication List    TAKE these medications   albuterol 108 (90  Base) MCG/ACT inhaler Commonly known as: VENTOLIN HFA Inhale 1-2 puffs into the lungs every 6 (six) hours as needed for wheezing or shortness of breath.   estradiol 1 MG tablet Commonly known as: ESTRACE Take 1 mg by mouth daily.   losartan 100 MG tablet Commonly known as: COZAAR Take 100 mg by mouth at bedtime.   multivitamin with minerals Tabs tablet Take 1 tablet by mouth daily.   Simbrinza 1-0.2 % Susp Generic drug: Brinzolamide-Brimonidine Place 1 drop into both eyes in the morning and at bedtime.   traMADol 50 MG tablet Commonly known as: Ultram Take 1 tablet (50 mg total) by mouth every 6 (six) hours as needed for up to 5 days (severe postop pain not controlled with tylenol and ibuprofen).          Signed: Hosie Spangle, [facility] Surgery 03/26/2020, 3:37 PM

## 2020-03-26 NOTE — H&P (Signed)
[facility] Admission Note  Margaret Wilkins 04/19/1941  [id number].    Requesting MD: Donnald GarrePfeiffer, MD Chief Complaint/Reason for Consult: possible cholecystitis   HPI:  Ms. Margaret Wilkins is a 79 y/o F with a PMH HTN and Ulcerative Colitis (Dx 14 years ago, sees Dr. Jamelle HaringSnow) who [facility] [place] [facility] with a cc abdominal pain. Pain started yesterday AM after breakfast (eggs, toast, fried rice) described as sharp upper abdominal pain. Patient went to lunch (small cup of potato soup) and then shopping with her daughter, the pain got progressively worse with radiation to her back. Associated sxs were diarrhea. Denies fever, chills, nausea, vomiting, or urinary sxs. Denies similar pain in the past. Patient had to leave her date with her daughter to go home, on the way home she continued to feel bad and thought she was having a heart attack so [facility] abdomen suggested possible cholecystitis with cholelithiasis and the [facility] by [facility].   She has NKDA. Denies a Hx of abdominal surgeries. Denies use of blood thinning medications. States she does not smoke cigarettes or drink alcohol.  She says she is planning to go to [place] with her daughters in 2 weeks for her 79th birthday  ROS: Review of Systems  All other systems reviewed and are negative.  No family history on file.  Past Medical History:  Diagnosis Date  . Asthma   . Generalized anxiety disorder   . Hypertension   . Ulcerative colitis [facility](HCC)     Past Surgical History:  Procedure Laterality Date  . BACK SURGERY      Social History:  reports that she has never smoked. She has never used smokeless tobacco. She reports that she does not drink alcohol and does not use drugs.  Allergies: No Known Allergies  (Not in a hospital admission)   Blood pressure (!) 152/59, pulse 66, temperature 97.8 F (36.6 C), temperature source Oral, resp. rate 17, height  5\' 4"  (1.626 m), weight 64.4 kg, SpO2 97 %. Physical Exam: Constitutional: NAD; conversant; no deformities Eyes: Moist conjunctiva; no lid lag; anicteric; PERRL Neck: Trachea midline; no thyromegaly Lungs: Normal respiratory effort; no tactile fremitus CV: RRR; no palpable thrills; no pitting edema GI: Abd soft, mild TTP RUQ with guarding, epigastrium and lower abdomen are non-tender, no palpable hepatosplenomegaly MSK: Normal gait; no clubbing/cyanosis Psychiatric: Appropriate affect; alert and oriented x3 Lymphatic: No palpable cervical or axillary lymphadenopathy  Results for orders placed or performed during the hospital encounter of 03/25/20 (from the past 48 hour(s))  Lipase, blood     Status: Abnormal   Collection Time: 03/25/20  3:43 PM  Result Value Ref Range   Lipase 55 (H) 11 - 51 U/L    Comment: Performed at [facility] [place], [address] [place], [place] [id number]  Comprehensive metabolic panel     Status: Abnormal   Collection Time: 03/25/20  3:43 PM  Result Value Ref Range   Sodium 135 135 - 145 mmol/L   Potassium 4.1 3.5 - 5.1 mmol/L   Chloride 101 98 - 111 mmol/L   CO2 23 22 - 32 mmol/L   Glucose, Bld 115 (H) 70 - 99 mg/dL    Comment: Glucose reference range applies only to samples taken after fasting for at least 8 hours.   BUN 34 (H) 8 - 23 mg/dL   Creatinine, Ser 1.911.11 (H) 0.44 - 1.00 mg/dL  Calcium 9.8 8.9 - 10.3 mg/dL   Total Protein 6.9 6.5 - 8.1 g/dL   Albumin 3.8 3.5 - 5.0 g/dL   AST 24 15 - 41 U/L   ALT 18 0 - 44 U/L   Alkaline Phosphatase 61 38 - 126 U/L   Total Bilirubin 0.4 0.3 - 1.2 mg/dL   GFR calc non Af Amer 47 (L) >60 mL/min   GFR calc Af Amer 55 (L) >60 mL/min   Anion gap 11 5 - 15    Comment: Performed at [facility], [address] [place], [place] [address]  CBC     Status: None   Collection Time: 03/25/20  3:43 PM  Result Value Ref Range   WBC 7.5 4.0 - 10.5 K/uL   RBC 4.13 3.87 - 5.11 MIL/uL   Hemoglobin 12.5  12.0 - 15.0 g/dL   HCT 25.4 36 - 46 %   MCV 89.8 80.0 - 100.0 fL   MCH 30.3 26.0 - 34.0 pg   MCHC 33.7 30.0 - 36.0 g/dL   RDW 27.0 62.3 - 76.2 %   Platelets 343 150 - 400 K/uL   nRBC 0.0 0.0 - 0.2 %    Comment: Performed at [facility], [address] [place], [place] [address]  Urinalysis, Routine w reflex microscopic     Status: Abnormal   Collection Time: 03/25/20  5:15 PM  Result Value Ref Range   Color, Urine YELLOW YELLOW   APPearance CLEAR CLEAR   Specific Gravity, Urine 1.025 1.005 - 1.030   pH 6.0 5.0 - 8.0   Glucose, UA NEGATIVE NEGATIVE mg/dL   Hgb urine dipstick TRACE (A) NEGATIVE   Bilirubin Urine NEGATIVE NEGATIVE   Ketones, ur NEGATIVE NEGATIVE mg/dL   Protein, ur NEGATIVE NEGATIVE mg/dL   Nitrite NEGATIVE NEGATIVE   Leukocytes,Ua NEGATIVE NEGATIVE    Comment: Performed at [facility], [address] [place], [place] [address]  Urinalysis, Microscopic (reflex)     Status: Abnormal   Collection Time: 03/25/20  5:15 PM  Result Value Ref Range   RBC / HPF 0-5 0 - 5 RBC/hpf   WBC, UA NONE SEEN 0 - 5 WBC/hpf   Bacteria, UA RARE (A) NONE SEEN   Squamous Epithelial / LPF 0-5 0 - 5    Comment: Performed at [facility], [address] [place], [place] [address]  SARS Coronavirus 2 by RT PCR (hospital order, performed in [facility] hospital lab) Nasopharyngeal Nasopharyngeal Swab     Status: None   Collection Time: 03/25/20  7:50 PM   Specimen: Nasopharyngeal Swab  Result Value Ref Range   SARS Coronavirus 2 NEGATIVE NEGATIVE    Comment: (NOTE) SARS-CoV-2 target nucleic acids are NOT DETECTED.  The SARS-CoV-2 RNA is generally detectable in upper and lower respiratory specimens during the acute phase of infection. The lowest concentration of SARS-CoV-2 viral copies this assay can detect is 250 copies / mL. A negative result does not preclude SARS-CoV-2 infection and should not be used as the sole basis for treatment or  other patient management decisions.  A negative result may occur with improper specimen collection / handling, submission of specimen other than nasopharyngeal swab, presence of viral mutation(s) within the areas targeted by this assay, and inadequate number of viral copies (<250 copies / mL). A negative result must be combined with clinical observations, patient history, and epidemiological information.  Fact Sheet for Patients:  BoilerBrush.com.cy  Fact Sheet for Healthcare Providers: https://pope.com/  This test is not yet approved or  cleared by the [place] FDA and has been authorized for detection and/or diagnosis of SARS-CoV-2 by FDA under an Emergency Use Authorization (EUA).  This EUA will remain in effect (meaning this test can be used) for the duration of the COVID-19 declaration under Section 564(b)(1) of the Act, 21 U.S.C. section 360bbb-3(b)(1), unless the authorization is terminated or revoked sooner.  Performed at [facility], [address] [place], [place] [address]   Comprehensive metabolic panel     Status: Abnormal   Collection Time: 03/26/20  6:03 AM  Result Value Ref Range   Sodium 138 135 - 145 mmol/L   Potassium 3.9 3.5 - 5.1 mmol/L   Chloride 109 98 - 111 mmol/L   CO2 23 22 - 32 mmol/L   Glucose, Bld 129 (H) 70 - 99 mg/dL    Comment: Glucose reference range applies only to samples taken after fasting for at least 8 hours.   BUN 26 (H) 8 - 23 mg/dL   Creatinine, Ser 8.46 (H) 0.44 - 1.00 mg/dL   Calcium 8.9 8.9 - 96.2 mg/dL   Total Protein 6.3 (L) 6.5 - 8.1 g/dL   Albumin 3.4 (L) 3.5 - 5.0 g/dL   AST 21 15 - 41 U/L   ALT 17 0 - 44 U/L   Alkaline Phosphatase 53 38 - 126 U/L   Total Bilirubin 0.6 0.3 - 1.2 mg/dL   GFR calc non Af Amer 48 (L) >60 mL/min   GFR calc Af Amer 56 (L) >60 mL/min   Anion gap 6 5 - 15    Comment: Performed at [facility], [address] [place], [place] [address]  CBC     Status: Abnormal   Collection Time: 03/26/20  6:03 AM  Result Value Ref Range   WBC 7.3 4.0 - 10.5 K/uL   RBC 3.90 3.87 - 5.11 MIL/uL   Hemoglobin 11.7 (L) 12.0 - 15.0 g/dL   HCT 13.2 (L) 36 - 46 %   MCV 92.1 80.0 - 100.0 fL   MCH 30.0 26.0 - 34.0 pg   MCHC 32.6 30.0 - 36.0 g/dL   RDW 44.0 10.2 - 72.5 %   Platelets 314 150 - 400 K/uL   nRBC 0.0 0.0 - 0.2 %    Comment: Performed at [facility], [address] [place], [place] [address]   CT Abdomen Pelvis W Contrast  Result Date: 03/25/2020 CLINICAL DATA:  Right upper quadrant abdominal pain, diarrhea EXAM: CT ABDOMEN AND PELVIS WITH CONTRAST TECHNIQUE: Multidetector CT imaging of the abdomen and pelvis was performed using the standard protocol following bolus administration of intravenous contrast. CONTRAST:  OMNIPAQUE IOHEXOL 300 MG/ML  SOLN COMPARISON:  10/27/2015 FINDINGS: Lower chest: Peribronchial nodularity within the posterior basal right lower lobe has progressed in the interval since the prior examination most in keeping with chronic mycobacterial or fungal infection. 6 mm subpleural pulmonary nodule within the left lung base is stable since prior examination and safely considered benign. The visualized heart and pericardium are unremarkable. Small hiatal hernia present. Hepatobiliary: The gallbladder is distended, small dependently layering sludge or stones is seen at the gallbladder neck (axial image # 24, coronal image # 42) and there is trace pericholecystic inflammatory stranding, all suspicious for changes of early acute calculus cholecystitis. Liver is unremarkable. No intra or extrahepatic biliary ductal dilation. Pancreas: Unremarkable Spleen:  Unremarkable Adrenals/Urinary Tract: Adrenal glands are unremarkable. Kidneys are normal, without renal calculi, focal lesion, or hydronephrosis. Bladder is unremarkable. Stomach/Bowel: Stomach, and small bowel are unremarkable.  There is severe pancolonic diverticulosis without superimposed inflammatory change. The large bowel is otherwise unremarkable. Appendix normal. No free intraperitoneal gas or fluid. Vascular/Lymphatic: No pathologic adenopathy within the abdomen and pelvis. The abdominal vasculature is age-appropriate with mild aortoiliac atherosclerotic calcification. No aneurysm. Reproductive: Status post hysterectomy. No adnexal masses. Other: Tiny fat containing umbilical hernia. Right inguinal cord lipoma. Rectum unremarkable. Musculoskeletal: Degenerative changes are seen within the lumbar spine and hips bilaterally. No lytic or blastic bone lesions are seen. IMPRESSION: 1. Distended gallbladder with small dependently layering sludge or stones at the gallbladder neck and trace pericholecystic inflammatory stranding, all suspicious for early acute calculus cholecystitis. Correlation with liver enzymes is recommended. 2. Severe pancolonic diverticulosis without superimposed inflammatory change. 3. Peribronchial nodularity within the posterior basal right lower lobe has progressed in the interval since the prior examination most in keeping with chronic mycobacterial or fungal infection. Aortic Atherosclerosis (ICD10-I70.0). Electronically Signed   By: Helyn Numbers MD   On: 03/25/2020 18:37   Assessment/Plan HTN Ulcerative Colitis Dx 14 years ago   Symptomatic cholelithiasis vs early cholecystitis  - afebrile, WBC WNL, LFTs WNL - RUQ tenderness and CT consistent with early acute cholecystitis  - recommend laparoscopic cholecystectomy today by Dr. Cliffton Asters - NPO, IVF, IV Rocephin  We discussed cholecystectomy in detail. We discussed the risks of bleeding, infection, conversion to open, damage to surrounding structures, need for additional procedures/surgeries, risks of general anesthesia including cardiac risks, CVA, and death and the patient is willing to proceed with surgery.    Adam Phenix, PA-C [facility] Please see Amion for pager number during day hours 7:00am-4:30pm 03/26/2020, 8:21 AM

## 2020-03-26 NOTE — ED Notes (Signed)
Pt ambulatory to the restroom with a steady gait. A&O x4.

## 2020-03-26 NOTE — Progress Notes (Signed)
2 different orders in place for patient. One is for discharge home, the second is for admit under observation.  Spoke with Dr. Cliffton Asters concerning the 2 different orders.  Dr. Cliffton Asters stated patient is to be discharged today from the hospital.  Orders were placed accordingly.

## 2020-03-26 NOTE — Progress Notes (Signed)
Condition Code-44 reviewed with patient next of Kin Andre Lefort.  Copy will be mailed to: [address]  [place] [address].

## 2020-03-26 NOTE — Anesthesia Procedure Notes (Signed)
Procedure Name: Intubation Date/Time: 03/26/2020 9:54 AM Performed by: Gerald Leitz, CRNA Pre-anesthesia Checklist: Patient identified, Patient being monitored, Timeout performed, Emergency Drugs available and Suction available Patient Re-evaluated:Patient Re-evaluated prior to induction Oxygen Delivery Method: Circle system utilized Preoxygenation: Pre-oxygenation with 100% oxygen Induction Type: IV induction Ventilation: Mask ventilation without difficulty Laryngoscope Size: Mac and 3 Grade View: Grade I Tube type: Oral Tube size: 7.0 mm Number of attempts: 1 Airway Equipment and Method: Stylet Placement Confirmation: ETT inserted through vocal cords under direct vision,  positive ETCO2 and breath sounds checked- equal and bilateral Secured at: 21 cm Tube secured with: Tape Dental Injury: Teeth and Oropharynx as per pre-operative assessment

## 2020-03-26 NOTE — Care Management Obs Status (Signed)
MEDICARE OBSERVATION STATUS NOTIFICATION   Patient Details  Name: Margaret Wilkins MRN: [id number] Date of Birth: 06/08/1941   Medicare Observation Status Notification Given:  Yes    Clearance Coots, LCSW 03/26/2020, 12:21 PM

## 2020-03-26 NOTE — Care Management CC44 (Signed)
Condition Code 44 Documentation Completed  Patient Details  Name: Margaret Wilkins MRN: [id number] Date of Birth: 1941-04-06   Condition Code 44 given:  Yes Patient signature on Condition Code 44 notice:  Yes Documentation of 2 MD's agreement:  Yes Code 44 added to claim:  Yes    Clearance Coots, LCSW 03/26/2020, 12:21 PM

## 2020-03-26 NOTE — Anesthesia Postprocedure Evaluation (Signed)
Anesthesia Post Note  Patient: Margaret Wilkins  Procedure(s) Performed: LAPAROSCOPIC CHOLECYSTECTOMY (N/A Abdomen)     Patient location during evaluation: PACU Anesthesia Type: General Level of consciousness: awake and alert, oriented and patient cooperative Pain management: pain level controlled Vital Signs Assessment: post-procedure vital signs reviewed and stable Respiratory status: spontaneous breathing, nonlabored ventilation and respiratory function stable Cardiovascular status: blood pressure returned to baseline and stable Postop Assessment: no apparent nausea or vomiting Anesthetic complications: no   No complications documented.  Last Vitals:  Vitals:   03/26/20 1130 03/26/20 1145  BP: (!) 153/73 (!) 150/69  Pulse: 73 73  Resp: 17 15  Temp:    SpO2: 100% 92%    Last Pain:  Vitals:   03/26/20 1145  TempSrc:   PainSc: 5                  Lannie Fields

## 2020-03-27 ENCOUNTER — Encounter (HOSPITAL_COMMUNITY): Payer: Self-pay | Admitting: Surgery

## 2020-03-27 LAB — SURGICAL PATHOLOGY

## 2022-02-25 ENCOUNTER — Emergency Department (HOSPITAL_BASED_OUTPATIENT_CLINIC_OR_DEPARTMENT_OTHER)
Admission: EM | Admit: 2022-02-25 | Discharge: 2022-02-25 | Disposition: A | Discharge status: Home / Self Care | Payer: Medicare Other | Attending: Emergency Medicine | Admitting: Emergency Medicine

## 2022-02-25 ENCOUNTER — Encounter (HOSPITAL_BASED_OUTPATIENT_CLINIC_OR_DEPARTMENT_OTHER): Payer: Self-pay | Admitting: *Deleted

## 2022-02-25 ENCOUNTER — Emergency Department (HOSPITAL_BASED_OUTPATIENT_CLINIC_OR_DEPARTMENT_OTHER): Payer: Medicare Other

## 2022-02-25 DIAGNOSIS — S199XXA Unspecified injury of neck, initial encounter: Secondary | ICD-10-CM | POA: Diagnosis present

## 2022-02-25 DIAGNOSIS — X58XXXA Exposure to other specified factors, initial encounter: Secondary | ICD-10-CM | POA: Insufficient documentation

## 2022-02-25 DIAGNOSIS — I10 Essential (primary) hypertension: Secondary | ICD-10-CM | POA: Diagnosis not present

## 2022-02-25 DIAGNOSIS — Z79899 Other long term (current) drug therapy: Secondary | ICD-10-CM | POA: Diagnosis not present

## 2022-02-25 DIAGNOSIS — R911 Solitary pulmonary nodule: Secondary | ICD-10-CM

## 2022-02-25 DIAGNOSIS — S161XXA Strain of muscle, fascia and tendon at neck level, initial encounter: Secondary | ICD-10-CM | POA: Insufficient documentation

## 2022-02-25 DIAGNOSIS — M542 Cervicalgia: Secondary | ICD-10-CM

## 2022-02-25 LAB — BASIC METABOLIC PANEL
Anion gap: 7 (ref 5–15)
BUN: 29 mg/dL — ABNORMAL HIGH (ref 8–23)
CO2: 26 mmol/L (ref 22–32)
Calcium: 10.3 mg/dL (ref 8.9–10.3)
Chloride: 106 mmol/L (ref 98–111)
Creatinine, Ser: 1.3 mg/dL — ABNORMAL HIGH (ref 0.44–1.00)
GFR, Estimated: 41 mL/min — ABNORMAL LOW (ref >60)
Glucose, Bld: 134 mg/dL — ABNORMAL HIGH (ref 70–99)
Potassium: 3.9 mmol/L (ref 3.5–5.1)
Sodium: 139 mmol/L (ref 135–145)

## 2022-02-25 LAB — CBC
HCT: 34 % — ABNORMAL LOW (ref 36.0–46.0)
Hemoglobin: 11.4 g/dL — ABNORMAL LOW (ref 12.0–15.0)
MCH: 29.8 pg (ref 26.0–34.0)
MCHC: 33.5 g/dL (ref 30.0–36.0)
MCV: 89 fL (ref 80.0–100.0)
Platelets: 364 10*3/uL (ref 150–400)
RBC: 3.82 MIL/uL — ABNORMAL LOW (ref 3.87–5.11)
RDW: 13.3 % (ref 11.5–15.5)
WBC: 5.6 10*3/uL (ref 4.0–10.5)
nRBC: 0 % (ref 0.0–0.2)

## 2022-02-25 MED ORDER — IOHEXOL 350 MG/ML SOLN
80.0000 mL | Freq: Once | INTRAVENOUS | Status: AC | PRN
  Administered 2022-02-25: 80 mL via INTRAVENOUS

## 2022-02-25 MED ORDER — ACETAMINOPHEN 500 MG PO TABS
1000.0000 mg | ORAL_TABLET | ORAL | Status: AC
Start: 2022-02-25 — End: 2022-02-25
  Administered 2022-02-25: 1000 mg via ORAL
  Filled 2022-02-25: qty 2

## 2022-02-25 MED ORDER — LIDOCAINE 4 % EX PTCH: 1.0000 | MEDICATED_PATCH | CUTANEOUS | 0 refills | Status: AC

## 2022-02-25 MED ORDER — LIDOCAINE 5 % EX PTCH
1.0000 | MEDICATED_PATCH | CUTANEOUS | Status: DC
  Administered 2022-02-25: 1 via TRANSDERMAL
  Filled 2022-02-25: qty 1

## 2022-02-25 NOTE — ED Provider Notes (Signed)
[facility] EMERGENCY DEPARTMENT Provider Note   CSN: [id number] Arrival date & time: 02/25/22  7591     History  Chief Complaint  Patient presents with   Neck Pain    Margaret Wilkins is a 81 y.o. female.  81 year old female with a history of hypertension who presents to the emergency department with right-sided neck pain.  Patient states she awoke on Tuesday morning with right-sided neck pain.  Says that it was severe and worsened by movement.  No known injuries to her neck.  Says that she did have a significant amount of coughing and sneezing the week before.  Says that since then the pain has become more severe and is limiting her mobility.  Says the pain is now radiating up to her head.  Has tried ice and heat without success in treating her symptoms.  Denies any vision changes, dizziness, numbness or weakness of her arms or legs, facial droop, fever, ear pain, or nausea or vomiting.   Neck Pain      Home Medications Prior to Admission medications   Medication Sig Start Date End Date Taking? Authorizing Provider  lidocaine (HM LIDOCAINE PATCH) 4 % Place 1 patch onto the skin daily for 14 doses. 02/25/22 03/11/22 Yes Rondel Baton, MD  albuterol (VENTOLIN HFA) 108 (90 Base) MCG/ACT inhaler Inhale 1-2 puffs into the lungs every 6 (six) hours as needed for wheezing or shortness of breath.    [provider]  estradiol (ESTRACE) 1 MG tablet Take 1 mg by mouth daily. 11/09/19   [provider]  losartan (COZAAR) 100 MG tablet Take 100 mg by mouth at bedtime. 03/06/20   [provider]  Multiple Vitamin (MULTIVITAMIN WITH MINERALS) TABS tablet Take 1 tablet by mouth daily.    [provider]  SIMBRINZA 1-0.2 % SUSP Place 1 drop into both eyes in the morning and at bedtime.  12/14/17   [provider]      Allergies    Patient has no known allergies.    Review of Systems   Review of Systems  Musculoskeletal:  Positive for neck  pain.    Physical Exam Updated Vital Signs BP (!) 144/64 (BP Location: Right Arm)   Pulse 60   Temp 97.6 F (36.4 C) (Oral)   Resp 18   SpO2 99%  Physical Exam Vitals and nursing note reviewed.  Constitutional:      General: She is not in acute distress.    Appearance: She is well-developed.  HENT:     Head: Normocephalic and atraumatic.     Right Ear: Tympanic membrane and external ear normal.     Left Ear: External ear normal.     Ears:     Comments: No tenderness to palpation of right mastoid.    Nose: Nose normal.     Mouth/Throat:     Mouth: Mucous membranes are moist.     Pharynx: Oropharynx is clear.  Eyes:     Extraocular Movements: Extraocular movements intact.     Conjunctiva/sclera: Conjunctivae normal.     Pupils: Pupils are equal, round, and reactive to light.  Neck:     Comments: Ice pack on right neck.  Significant pain with turning her neck.  Able to flex neck. Cardiovascular:     Rate and Rhythm: Normal rate and regular rhythm.     Heart sounds: No murmur heard. Pulmonary:     Effort: Pulmonary effort is normal. No respiratory distress.  Breath sounds: Normal breath sounds.  Abdominal:     General: Abdomen is flat. There is no distension.     Palpations: Abdomen is soft. There is no mass.     Tenderness: There is no abdominal tenderness. There is no guarding.  Musculoskeletal:        General: No swelling.     Right lower leg: No edema.     Left lower leg: No edema.  Skin:    General: Skin is warm and dry.     Capillary Refill: Capillary refill takes less than 2 seconds.  Neurological:     Mental Status: She is alert.     Comments: MENTAL STATUS: AAOx3 CRANIAL NERVES: II: Pupils equal and reactive 3 mm BL, no RAPD, no VF deficits III, IV, VI: EOM intact, no gaze preference or deviation, no nystagmus. V: normal sensation to light touch in V1, V2, and V3 segments bilaterally VII: no facial weakness or asymmetry, no nasolabial fold  flattening VIII: normal hearing to speech and finger friction IX, X: normal palatal elevation, no uvular deviation XI: 5/5 head turn and 5/5 shoulder shrug bilaterally XII: midline tongue protrusion MOTOR: 5/5 strength in R shoulder flexion, elbow flexion and extension, and grip strength. 5/5 strength in L shoulder flexion, elbow flexion and extension, and grip strength.  5/5 strength in R hip and knee flexion, knee extension, ankle plantar and dorsiflexion. 5/5 strength in L hip and knee flexion, knee extension, ankle plantar and dorsiflexion. SENSORY: Normal sensation to light touch in all extremities COORD: Normal finger to nose and heel to shin, no tremor, no dysmetria STATION: normal stance, no truncal ataxia GAIT: Normal   Psychiatric:        Mood and Affect: Mood normal.     ED Results / Procedures / Treatments   Labs (all labs ordered are listed, but only abnormal results are displayed) Labs Reviewed  BASIC METABOLIC PANEL - Abnormal; Notable for the following components:      Result Value   Glucose, Bld 134 (*)    BUN 29 (*)    Creatinine, Ser 1.30 (*)    GFR, Estimated 41 (*)    All other components within normal limits  CBC - Abnormal; Notable for the following components:   RBC 3.82 (*)    Hemoglobin 11.4 (*)    HCT 34.0 (*)    All other components within normal limits    EKG EKG Interpretation  Date/Time:  Thursday February 25 2022 08:00:53 EDT Ventricular Rate:  82 PR Interval:  158 QRS Duration: 87 QT Interval:  378 QTC Calculation: 442 R Axis:   -6 Text Interpretation: Sinus rhythm LVH by voltage When compared to 03/25/20 now has LVH Confirmed by Vonita Moss [phone]) on 02/25/2022 8:10:53 AM  Radiology CT Angio Neck W and/or Wo Contrast  Result Date: 02/25/2022 CLINICAL DATA:  81 year old female with recent neck pain radiating to the top of the head. Stiffness. EXAM: CT ANGIOGRAPHY HEAD AND NECK TECHNIQUE: Multidetector CT imaging of the head and  neck was performed using the standard protocol during bolus administration of intravenous contrast. Multiplanar CT image reconstructions and MIPs were obtained to evaluate the vascular anatomy. Carotid stenosis measurements (when applicable) are obtained utilizing NASCET criteria, using the distal internal carotid diameter as the denominator. RADIATION DOSE REDUCTION: This exam was performed according to the departmental dose-optimization program which includes automated exposure control, adjustment of the mA and/or kV according to patient size and/or use of iterative reconstruction technique. CONTRAST:  40mL OMNIPAQUE IOHEXOL 350 MG/ML SOLN COMPARISON:  Thyroid ultrasound 09/02/2015. chest CT 08/29/2015 FINDINGS: CT HEAD Brain: No midline shift, ventriculomegaly, mass effect, evidence of mass lesion, intracranial hemorrhage or evidence of cortically based acute infarction. Patchy bilateral cerebral white matter hypodensity, moderate for age. No cortical encephalomalacia identified. Calvarium and skull base: Negative. Paranasal sinuses: Visualized paranasal sinuses and mastoids are clear. Orbits: No acute orbit or scalp soft tissue finding. CTA NECK Skeleton: Mild for age cervical spine degeneration. No acute osseous abnormality identified. No CT evidence of cervical spinal stenosis. Upper chest: Subpleural reticular scarring greater in the left lung with a superimposed solid 7 mm nodule in the left upper lobe series 9, image 8. This is new or progressed since 2017. No visible hilar or mediastinal lymphadenopathy. Visible central pulmonary arteries appear patent. Other neck: Chronic thyroid goiter This has been evaluated on previous imaging. (ref: J Am Coll Radiol. 2015 Feb;12(2): 143-50).Otherwise negative. Aortic arch: 4 vessel arch configuration, the left vertebral artery arises directly from the arch. Mild arch tortuosity. Calcified aortic atherosclerosis. Right carotid system: Mild tortuosity of the  brachiocephalic artery and proximal right CCA without plaque or stenosis. Soft and calcified plaque at the right carotid bifurcation is mild for age. No stenosis. Left carotid system: Similar tortuosity and mild for age atherosclerosis. There is a greater degree of soft plaque at the posterior left ICA origin on series 9, image 100. But no significant stenosis. Vertebral arteries: Calcified plaque in the proximal right subclavian artery without stenosis. Normal right vertebral artery origin. Late entry of the right vertebral artery into the transverse foramen, and the right vertebral appears mildly dominant to the skull base. No plaque or stenosis. Left vertebral artery arises directly from the arch with a normal origin. The left vertebral is mildly non dominant throughout, with a late entry into the cervical transverse foramen and tortuous V2 segment. No left vertebral plaque or stenosis to the skull base. CTA HEAD Posterior circulation: Mildly dominant right V4 segment. Minimal calcified vertebral artery plaque at the crossing of the dura. No distal vertebral or vertebrobasilar junction stenosis. Normal left PICA origin. Right AICA appears dominant and patent. Patent basilar artery without stenosis. Normal SCA and PCA origins. Normal posterior communicating arteries. Bilateral PCA branches are patent, mild irregularity on the right. Anterior circulation: Both ICA siphons are patent. Bilateral siphon tortuosity with mild calcified plaque which is greater on the right. No significant siphon stenosis. Normal posterior communicating artery origins. Patent carotid termini, MCA and ACA origins. Dominant left ACA A1 segment. Normal anterior communicating artery. Bilateral ACA branches are within normal limits. Left MCA M1 segment and trifurcation are patent without stenosis. Left MCA branches are within normal limits. Right MCA M1 segment and bifurcation are patent without stenosis. Right MCA branches are within normal  limits. Venous sinuses: Patent. Anatomic variants: Left vertebral artery arises directly from the arch and is mildly non dominant. Dominant left ACA A1. Review of the MIP images confirms the above findings IMPRESSION: 1. Negative for large vessel occlusion or dissection. Mild for age atherosclerosis in the head and neck. No significant arterial stenosis. 2. Positive for a 7 mm left upper lobe pulmonary nodule which is new or progressed since 2017 on incomplete CT of the chest. Recommend prompt follow-up Chest CT (Guidelines for Management of Incidental Pulmonary Nodules Detected on CT Images: From the Fleischner Society 2017; Radiology 2017; [id number]). 3. No acute intracranial abnormality. Moderate for age cerebral white matter changes most commonly due to small vessel  disease. 4. Aortic Atherosclerosis (ICD10-I70.0). Electronically Signed   By: Odessa Fleming M.D.   On: 02/25/2022 10:19   CT Angio Head W or Wo Contrast  Result Date: 02/25/2022 CLINICAL DATA:  81 year old female with recent neck pain radiating to the top of the head. Stiffness. EXAM: CT ANGIOGRAPHY HEAD AND NECK TECHNIQUE: Multidetector CT imaging of the head and neck was performed using the standard protocol during bolus administration of intravenous contrast. Multiplanar CT image reconstructions and MIPs were obtained to evaluate the vascular anatomy. Carotid stenosis measurements (when applicable) are obtained utilizing NASCET criteria, using the distal internal carotid diameter as the denominator. RADIATION DOSE REDUCTION: This exam was performed according to the departmental dose-optimization program which includes automated exposure control, adjustment of the mA and/or kV according to patient size and/or use of iterative reconstruction technique. CONTRAST:  9mL OMNIPAQUE IOHEXOL 350 MG/ML SOLN COMPARISON:  Thyroid ultrasound 09/02/2015. chest CT 08/29/2015 FINDINGS: CT HEAD Brain: No midline shift, ventriculomegaly, mass effect, evidence of  mass lesion, intracranial hemorrhage or evidence of cortically based acute infarction. Patchy bilateral cerebral white matter hypodensity, moderate for age. No cortical encephalomalacia identified. Calvarium and skull base: Negative. Paranasal sinuses: Visualized paranasal sinuses and mastoids are clear. Orbits: No acute orbit or scalp soft tissue finding. CTA NECK Skeleton: Mild for age cervical spine degeneration. No acute osseous abnormality identified. No CT evidence of cervical spinal stenosis. Upper chest: Subpleural reticular scarring greater in the left lung with a superimposed solid 7 mm nodule in the left upper lobe series 9, image 8. This is new or progressed since 2017. No visible hilar or mediastinal lymphadenopathy. Visible central pulmonary arteries appear patent. Other neck: Chronic thyroid goiter This has been evaluated on previous imaging. (ref: J Am Coll Radiol. 2015 Feb;12(2): 143-50).Otherwise negative. Aortic arch: 4 vessel arch configuration, the left vertebral artery arises directly from the arch. Mild arch tortuosity. Calcified aortic atherosclerosis. Right carotid system: Mild tortuosity of the brachiocephalic artery and proximal right CCA without plaque or stenosis. Soft and calcified plaque at the right carotid bifurcation is mild for age. No stenosis. Left carotid system: Similar tortuosity and mild for age atherosclerosis. There is a greater degree of soft plaque at the posterior left ICA origin on series 9, image 100. But no significant stenosis. Vertebral arteries: Calcified plaque in the proximal right subclavian artery without stenosis. Normal right vertebral artery origin. Late entry of the right vertebral artery into the transverse foramen, and the right vertebral appears mildly dominant to the skull base. No plaque or stenosis. Left vertebral artery arises directly from the arch with a normal origin. The left vertebral is mildly non dominant throughout, with a late entry into the  cervical transverse foramen and tortuous V2 segment. No left vertebral plaque or stenosis to the skull base. CTA HEAD Posterior circulation: Mildly dominant right V4 segment. Minimal calcified vertebral artery plaque at the crossing of the dura. No distal vertebral or vertebrobasilar junction stenosis. Normal left PICA origin. Right AICA appears dominant and patent. Patent basilar artery without stenosis. Normal SCA and PCA origins. Normal posterior communicating arteries. Bilateral PCA branches are patent, mild irregularity on the right. Anterior circulation: Both ICA siphons are patent. Bilateral siphon tortuosity with mild calcified plaque which is greater on the right. No significant siphon stenosis. Normal posterior communicating artery origins. Patent carotid termini, MCA and ACA origins. Dominant left ACA A1 segment. Normal anterior communicating artery. Bilateral ACA branches are within normal limits. Left MCA M1 segment and trifurcation are patent without  stenosis. Left MCA branches are within normal limits. Right MCA M1 segment and bifurcation are patent without stenosis. Right MCA branches are within normal limits. Venous sinuses: Patent. Anatomic variants: Left vertebral artery arises directly from the arch and is mildly non dominant. Dominant left ACA A1. Review of the MIP images confirms the above findings IMPRESSION: 1. Negative for large vessel occlusion or dissection. Mild for age atherosclerosis in the head and neck. No significant arterial stenosis. 2. Positive for a 7 mm left upper lobe pulmonary nodule which is new or progressed since 2017 on incomplete CT of the chest. Recommend prompt follow-up Chest CT (Guidelines for Management of Incidental Pulmonary Nodules Detected on CT Images: From the Fleischner Society 2017; Radiology 2017; [phone] [id number]:228-243). 3. No acute intracranial abnormality. Moderate for age cerebral white matter changes most commonly due to small vessel disease. 4. Aortic  Atherosclerosis (ICD10-I70.0). Electronically Signed   By: Odessa FlemingH  Hall M.D.   On: 02/25/2022 10:19    Procedures Procedures   Medications Ordered in ED Medications  acetaminophen (TYLENOL) tablet 1,000 mg (1,000 mg Oral Given 02/25/22 0843)  iohexol (OMNIPAQUE) 350 MG/ML injection 80 mL (80 mLs Intravenous Contrast Given 02/25/22 0948)    ED Course/ Medical Decision Making/ A&P                           Medical Decision Making Amount and/or Complexity of Data Reviewed Labs: ordered. Radiology: ordered.  Risk OTC drugs. Prescription drug management.   81 year old female with a history of hypertension who presents to the emergency department with right-sided neck pain.   Initial DDx: msk pain, pathologic C-spine fracture, vertebral/carotid artery dissection  The patient's symptoms are likely due to MSK pain but given the sneezing the week before and cough dissection is also a possibility.  No infectious symptoms or signs of otitis mastoid tenderness to suggest mastoiditis or CNS infection.  Plan:  Labs CTA of the head and neck Tylenol Lidocaine patch Heat  ED Summary:  Patient underwent the above work-up which did not show evidence of dissection or pathologic fracture.  She was given the above treatments with improvement of her symptoms.  Patient was instructed to follow-up with her primary doctor in several days regarding her symptoms.  Final Clinical Impression(s) / ED Diagnoses Final diagnoses:  Neck pain  Acute strain of neck muscle, initial encounter  Lung nodule seen on imaging study    Rx / DC Orders ED Discharge Orders          Ordered    lidocaine (HM LIDOCAINE PATCH) 4 %  Every 24 hours        02/25/22 1034              Rondel BatonPaterson, Melba Araki C, MD 02/25/22 1951

## 2022-02-25 NOTE — Discharge Instructions (Addendum)
Today you were seen in the emergency department for your neck pain.    In the emergency department you had a CT scan done of your neck which did not show evidence of blood vessel injury or bony injury.    It is likely that it is from a strain.  At home, please take Tylenol and lidocaine patches along with heat and ice as needed for your neck.    Follow-up with your primary doctor in 2-3 days regarding your visit.  Please discuss the lung nodule that was found on your CT scan with them as well.  Return immediately to the emergency department if you experience any of the following: Numbness or weakness of your arms or legs, dizziness, or any other concerning symptoms.    Thank you for visiting our Emergency Department. It was a pleasure taking care of you today.

## 2022-02-25 NOTE — ED Notes (Signed)
Client positioned for comfort, heat pack applied to neck area to aid in pain control .  Denies any neuro s/s No recent injuries or falls per pt statement

## 2022-02-25 NOTE — ED Triage Notes (Signed)
For the past few days has had neck pain that radiates to "the top of my head", neck feels stiff at times. Pain is variable. Denies any fevers
